# Patient Record
Sex: Male | Born: 1985
Health system: Southern US, Community
[De-identification: ages and names within clinical notes are randomized; demographics above are authoritative.]

## PROBLEM LIST (undated history)

## (undated) HISTORY — PX: NO PAST SURGERIES: SHX2092

---
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
## 2007-07-27 ENCOUNTER — Ambulatory Visit: Admission: RE | Admit: 2007-07-27 | Discharge: 2007-08-20 | Payer: Self-pay | Admitting: Radiation Oncology

## 2007-10-20 ENCOUNTER — Emergency Department (HOSPITAL_COMMUNITY): Admission: EM | Admit: 2007-10-20 | Discharge: 2007-10-20 | Payer: Self-pay | Admitting: Emergency Medicine

## 2008-04-16 ENCOUNTER — Emergency Department (HOSPITAL_COMMUNITY): Admission: EM | Admit: 2008-04-16 | Discharge: 2008-04-16 | Payer: Self-pay | Admitting: Family Medicine

## 2009-06-19 ENCOUNTER — Emergency Department (HOSPITAL_COMMUNITY): Admission: EM | Admit: 2009-06-19 | Discharge: 2009-06-19 | Payer: Self-pay | Admitting: Family Medicine

## 2011-02-26 LAB — POCT H PYLORI SCREEN: H. PYLORI SCREEN, POC: NEGATIVE

## 2011-10-21 ENCOUNTER — Encounter (HOSPITAL_COMMUNITY): Payer: Self-pay | Admitting: Emergency Medicine

## 2011-10-21 ENCOUNTER — Emergency Department (HOSPITAL_COMMUNITY)
Admission: EM | Admit: 2011-10-21 | Discharge: 2011-10-21 | Disposition: A | Discharge status: Home / Self Care | Payer: Self-pay | Attending: Emergency Medicine | Admitting: Emergency Medicine

## 2011-10-21 DIAGNOSIS — S01501A Unspecified open wound of lip, initial encounter: Secondary | ICD-10-CM | POA: Insufficient documentation

## 2011-10-21 DIAGNOSIS — W219XXA Striking against or struck by unspecified sports equipment, initial encounter: Secondary | ICD-10-CM | POA: Insufficient documentation

## 2011-10-21 DIAGNOSIS — S01511A Laceration without foreign body of lip, initial encounter: Secondary | ICD-10-CM

## 2011-10-21 MED ORDER — AMOXICILLIN-POT CLAVULANATE 875-125 MG PO TABS: 1.0000 | ORAL_TABLET | Freq: Two times a day (BID) | ORAL | Status: AC

## 2011-10-21 MED ORDER — CHLORHEXIDINE GLUCONATE 0.12 % MT SOLN: 15.0000 mL | Freq: Two times a day (BID) | OROMUCOSAL | Status: AC

## 2011-10-21 MED ORDER — IBUPROFEN 800 MG PO TABS: 800.0000 mg | ORAL_TABLET | Freq: Three times a day (TID) | ORAL | Status: AC | PRN

## 2011-10-21 NOTE — ED Notes (Signed)
MD at bedside for suturing of lip lac

## 2011-10-21 NOTE — ED Notes (Signed)
MD done with suturing.

## 2011-10-21 NOTE — ED Notes (Signed)
PT. PRESENTS WITH LACERATION AT RIGHT UPPER LIP / SWELLING ACCIDENTALLY HIT WITH A PLAYER'S HEAD WHILE PLAYING BASKETBALL THIS EVENING .

## 2011-10-21 NOTE — Discharge Instructions (Signed)
Have sutures out in 5 days. Return here as needed.  °

## 2011-10-21 NOTE — ED Provider Notes (Signed)
History     CSN: [id number]  Arrival date & time 10/21/11  1945   First MD Initiated Contact with Patient 10/21/11 1954      Chief Complaint  Patient presents with  . Lip Laceration    (Consider location/radiation/quality/duration/timing/severity/associated sxs/prior treatment) HPI Patient presents emergency department with a laceration to his right upper lip.  This was fine basketball when he had his lip from the head from another player.  Patient, states he did not lose consciousness and does not have any teeth that are damaged or loose.  Patient has headache, blurred vision, nausea, or vomiting.  Patient, states his tetanus shot is up-to-date. History reviewed. No pertinent past medical history.  History reviewed. No pertinent past surgical history.  No family history on file.  History  Substance Use Topics  . Smoking status: Never Smoker   . Smokeless tobacco: Not on file  . Alcohol Use: Yes      Review of Systems All other systems negative except as documented in the HPI. All pertinent positives and negatives as reviewed in the HPI.  Allergies  Review of patient's allergies indicates no known allergies.  Home Medications  No current outpatient prescriptions on file.  BP 130/87  Pulse 107  Temp(Src) 98.1 F (36.7 C) (Oral)  Resp 16  SpO2 98%  Physical Exam  Constitutional: He is oriented to person, place, and time. He appears well-developed and well-nourished. No distress.  HENT:  Head: Normocephalic.  Mouth/Throat: Oropharynx is clear and moist. Normal dentition.    Eyes: Pupils are equal, round, and reactive to light.  Neurological: He is alert and oriented to person, place, and time.    ED Course  Procedures (including critical care time)  LACERATION REPAIR Performed by: Carlyle Dolly Authorized by: Carlyle Dolly Consent: Verbal consent obtained. Risks and benefits: risks, benefits and alternatives were discussed Consent given  by: patient Patient identity confirmed: provided demographic data Prepped and Draped in normal sterile fashion Wound explored  Laceration Location: upper lip on the R mid and inferior border extending into the oral mucosa  Laceration Length: 0.5cm mid upper right lip. Then 3.5 cms on the inferior border extending to the oral mucosa.  No Foreign Bodies seen or palpated  Anesthesia: local infiltration  Local anesthetic: lidocaine 2%  Anesthetic total: 4 ml  Irrigation method: syringe Amount of cleaning: standard  Skin closure: prolene 7-0  Number of sutures: 3- 1 in the mid upper lip small lac and 2 in the inferior lip   Technique: Simple interrupted  Patient tolerance: Patient tolerated the procedure well with no immediate complications.  The oral mucosa is left open.  MDM          Carlyle Dolly, PA-C 10/21/11 2107

## 2011-10-21 NOTE — ED Provider Notes (Signed)
Medical screening examination/treatment/procedure(s) were performed by non-physician practitioner and as supervising physician I was immediately available for consultation/collaboration.  Ethelda Chick, MD 10/21/11 2120

## 2015-06-14 ENCOUNTER — Emergency Department (HOSPITAL_COMMUNITY)
Admission: EM | Admit: 2015-06-14 | Discharge: 2015-06-14 | Disposition: A | Discharge status: Home / Self Care | Payer: Self-pay | Attending: Emergency Medicine | Admitting: Emergency Medicine

## 2015-06-14 ENCOUNTER — Encounter (HOSPITAL_COMMUNITY): Payer: Self-pay | Admitting: *Deleted

## 2015-06-14 DIAGNOSIS — K0889 Other specified disorders of teeth and supporting structures: Secondary | ICD-10-CM | POA: Insufficient documentation

## 2015-06-14 MED ORDER — IBUPROFEN 800 MG PO TABS: 800.0000 mg | ORAL_TABLET | Freq: Three times a day (TID) | ORAL | Status: DC | PRN

## 2015-06-14 NOTE — ED Notes (Signed)
Pt reports intermittent dental pain and sensitivity to cold fluids  For 1 week.

## 2015-06-14 NOTE — Discharge Instructions (Signed)
Read the information below.  Use the prescribed medication as directed.  Please discuss all new medications with your pharmacist.  You may return to the Emergency Department at any time for worsening condition or any new symptoms that concern you.   Please call the dentist listed above within 48 hours to schedule a close follow up appointment.  If you develop fevers, swelling in your face, difficulty swallowing or breathing, return to the ER immediately for a recheck.   ° ° °Emergency Department Resource Guide °1) Find a Doctor and Pay Out of Pocket °Although you won't have to find out who is covered by your insurance plan, it is a good idea to ask around and get recommendations. You will then need to call the office and see if the doctor you have chosen will accept you as a new patient and what types of options they offer for patients who are self-pay. Some doctors offer discounts or will set up payment plans for their patients who do not have insurance, but you will need to ask so you aren't surprised when you get to your appointment. ° °2) Contact Your Local Health Department °Not all health departments have doctors that can see patients for sick visits, but many do, so it is worth a call to see if yours does. If you don't know where your local health department is, you can check in your phone book. The CDC also has a tool to help you locate your state's health department, and many state websites also have listings of all of their local health departments. ° °3) Find a Walk-in Clinic °If your illness is not likely to be very severe or complicated, you may want to try a walk in clinic. These are popping up all over the country in pharmacies, drugstores, and shopping centers. They're usually staffed by nurse practitioners or physician assistants that have been trained to treat common illnesses and complaints. They're usually fairly quick and inexpensive. However, if you have serious medical issues or chronic  medical problems, these are probably not your best option. ° °No Primary Care Doctor: °- Call [facility] at  832-8000 - they can help you locate a primary care doctor that  accepts your insurance, provides certain services, etc. °- Physician Referral Service- 1-800-533-3463 ° °Chronic Pain Problems: °Organization         Address  Phone   Notes  °[facility]  (336) 297-2271 Patients need to be referred by their primary care doctor.  ° °Medication Assistance: °Organization         Address  Phone   Notes  °[place] [facility] [address] °[place], [place] [address] (336) 641-8030 --Must be a resident of [place] °-- Must have NO insurance coverage whatsoever (no Medicaid/ Medicare, etc.) °-- The pt. MUST have a primary care doctor that directs their care regularly and follows them in the community °  °[facility]  (866) 331-1348   °[employer]  (888) 892-1162   ° °Agencies that provide inexpensive medical care: °Organization         Address  Phone   Notes  °[facility]  (336) 832-8035   °[facility]    (336) 832-7272   °[facility] [address] °[place], [place] [address] (336) 832-4777   °[facility] [address], °[place] (336) 271-4999   °[facility]    (336) 373-0678   °[facility]    (336) 272-1050   °[facility] ° [address], [place] Phone:  (336) 832-4444, Fax:  (336) 832-4440 Hours of Operation:  9 am - 6 pm, M-F.  Also accepts Medicaid/Medicare and self-pay.  °[facility] ° [address], [place] Phone: (336) 832-3150, Fax: (336) 832-3151. Hours of Operation:  8:30 am - 5:30 pm, M-F.  Also accepts Medicaid and self-pay.  °[facility] [place] [address], [place] Phone: (336) 878-6027   °[facility] [address], [place], [place] (336)723-1848,  Ext. 123 Mondays & Thursdays: 7-9 AM.  First 15 patients are seen on a first come, first serve basis. °  ° °Medicaid-accepting [place] Providers: ° °Organization         Address  Phone   Notes  °[facility] [address], [place] (336) 641-2100 Also accepts self-pay patients.  °[facility] [address], [place] ° (336) 856-9996   °[facility] [address], [place] (336) 288-8857   °[facility] [address] [place] [address], [place] (336) 299-7000   °Veita Bland [address], [place]  ° (336) 373-1557 Only accepts  Access Medicaid patients after they have their name applied to their card.  ° °Self-Pay (no insurance) in [place]: ° °Organization         Address  Phone   Notes  °Sickle Cell Patients, [facility] [address], [place] (336) 832-1970   °[facility] [address], [place] (336) 832-4400   °[facility] ° [address], [place] (336) 992-4800   °[facility]/Dr. Osei-Bonsu ° [address] [place] [address], [place] or [address], [place] (336) 841-8500 Phone number for both [place] and [place] locations is the same.  °[facility] [address], [place] (336) 299-0000   °[facility] [address] [place] [address], [place] or [address] (336) 852-7530 °(336) 878-2260   °[facility] [address], [place] (336) 350-1642, phone; (336) 294-5005, fax Sees patients 1st and 3rd Saturday of every month.  Must not qualify for public or private insurance (i.e. Medicaid, Medicare, [place] Health Choice, Veterans' Benefits) • Household income should be no more than 200% of the poverty level •The clinic cannot treat you if you are pregnant or think you are pregnant • Sexually transmitted diseases are not  treated at the clinic.  ° ° °Dental Care: °Organization         Address  Phone  Notes  °[facility] [address],  (336) 641-6152 Accepts children up to age 21 who are enrolled in Medicaid or [place] Health Choice; pregnant women with a Medicaid card; and children who have applied for Medicaid or [place] Health Choice, but were declined, whose parents can pay a reduced fee at time of service.  °[facility] [place]  [address], [place] (336) 641-7733 Accepts children up to age 21 who are enrolled in Medicaid or [place] Health Choice; pregnant women with a Medicaid card; and children who have applied for Medicaid or [place] Health Choice, but were declined, whose parents can pay a reduced fee at time of service.  °  [facility] ° [address], [place] (336) 641-4533 Patients are seen by appointment only. Walk-ins are not accepted. [facility] will see patients 18 years of age and older. °Monday - Tuesday (8am-5pm) °Most Wednesdays (8:30-5pm) °$30 per visit, cash only  °[facility] ° [address], [place] (336) 641-4533 Patients are seen by appointment only. Walk-ins are not accepted. [facility] will see patients 18 years of age and older. °One Wednesday Evening (Monthly: Volunteer Based).  $30 per visit, cash only  °[facility]  (919) 537-3737 for adults; Children under age 4, call Graduate Pediatric Dentistry at (919) [phone]. Children aged 4-14, please call (919) 537-3737 to request a pediatric application. ° Dental services are provided in all areas of dental care including fillings, crowns and bridges, complete and partial dentures, implants, gum treatment, root canals, and extractions. Preventive care is also provided. Treatment is provided to both adults and children. °Patients are selected via a lottery and there is  often a waiting list. °  °[facility] [address], °[place] ° (336) 763-8833 [email or web address] °  °[facility] [address], [place], [place] (336)723-1848, Ext. 123 Second and Fourth Thursday of each month, opens at 6:30 AM; Clinic ends at 9 AM.  Patients are seen on a first-come first-served basis, and a limited number are seen during each clinic.  ° °[facility] ° [address], [place], [place] (336) 723-7904   Eligibility Requirements °You must have lived in [place], [place], or [place] counties for at least the last three months. °  You cannot be eligible for state or federal sponsored healthcare insurance, including Veterans Administration, Medicaid, or Medicare. °  You generally cannot be eligible for healthcare insurance through your employer.  °  How to apply: °Eligibility screenings are held every Tuesday and Wednesday afternoon from 1:00 pm until 4:00 pm. You do not need an appointment for the interview!  °[facility] [address], [place], [place] 336-631-2330   °[facility]  336-342-8273   °[facility]  336-703-3100   ° [facility]  336-570-6415   ° °Behavioral Health Resources in the Community: °Intensive Outpatient Programs °Organization         Address  Phone  Notes  °[facility] [address], [place], [place] 336-878-6098   °[facility] [address], [place], [place] 336-832-9800   °[facility] [address], [place], [place] ° 336-882-2125   °[facility] [address],  °[place],  1-800-853-5163 or 336-641-4981   °Substance Abuse Resources °Organization         Address  Phone  Notes  °[facility]  336-882-2125   °[facility]  336-784-9470   °[facility]  336-285-9073   °[facility]  336-845-3988   °Residential & Outpatient Substance  Abuse Program  1-800-659-3381   °Psychological Services °Organization         Address  Phone  Notes  °[facility]  336- 832-9600   °[facility]  336- 378-7881   °[facility] [address],  1-800-853-5163 or 336-641-4981   ° °Mobile Crisis Teams °Organization         Address  Phone  Notes  °[facility], Mobile Crisis Care Unit  1-877-626-1772   °Assertive °Psychotherapeutic Services ° [address] [place], [place] 336-834-9664   °Sharon DeEsch [address] °[place] 336-554-5454   ° °Self-Help/Support Groups °Organization         Address  Phone             Notes  °[facility] [place] - variety of support groups  336- 373-1402 Call for more information  °Narcotics Anonymous (NA), [facility] [address], °[place]  2 meetings at this location  ° °Residential Treatment Programs °Organization         Address  Phone  Notes  °[facility] [address],    °[place]  1-866-801-8205   °[facility] ° [address], [place], [place] 704-293-8524   °[facility] [address], [place] 336-845-3988 Admissions: 8am-3pm M-F  °[facility] [address]    °[place], [place] 336-841-1104   °[facility] [address], [place], [place] 336-379-7146   °[facility] [address]  °[place], [place] 336-285-9073   °[facility] - Intensive Outpatient [address], [place], [place] 336-852-3033   °[facility] [address]  °[place], [place] 1-877-615-2722 or 336-784-9470   °[facility] [address] [place], [place] 336-227-7417 Accepts Medicaid  °[facility] [address]  °[place] 1-800-659-3381 Substance Abuse/Addiction Treatment  ° °[place] Behavioral Health Resources °Organization          Address  Phone  Notes  °[facility]  (888) 581-9988   °Julie Brannon, PhD [address] [place], [place]   (336) 349-5553 or (336) 951-0000   °[facility]   [address] °[place], [place] (336) 349-4454   °[facility] [address], [place], [place] (336) 342-8316 Insurance/Medicaid/sponsorship through [facility]  °[facility] [address]                                    [place], [place] (336) 342-8316 Therapy/tele-psych/case  °[facility] [address]  ° [place], [place] (336) 349-2233    °Dr. Arfeen  (336) 349-4544   °[facility] [place]  [employer] [place] [facility] 1) [address], [place] °2) [address], [place] °3)  [address], [place] (336) 349-3220 °(336) 342-7768 ° °(336) 342-8140   °[place] [facility] (336) 342-1394 or (336) 342-3537 (After Hours)    ° ° ° °

## 2015-06-14 NOTE — ED Provider Notes (Signed)
CSN: [id number]     Arrival date & time 06/14/15  [id number] History   First MD Initiated Contact with Patient 06/14/15 [phone]     Chief Complaint  Patient presents with  . Dental Pain     (Consider location/radiation/quality/duration/timing/severity/associated sxs/prior Treatment) The history is provided by the patient.     Pt presents with right upper and right lower dental pain.  States the pain has been going on for a little over a week.  Pain is present with cold air, occasionally with certain movements, with lying on the right side.   Denies fevers, facial swelling, sore throat, difficulty swallowing or breathing.  Has taken motrin with some relief.  Has no dentist.     History reviewed. No pertinent past medical history. History reviewed. No pertinent past surgical history. No family history on file. Social History  Substance Use Topics  . Smoking status: Never Smoker   . Smokeless tobacco: None  . Alcohol Use: Yes    Review of Systems  Constitutional: Negative for fever and chills.  HENT: Positive for dental problem. Negative for facial swelling, sore throat and trouble swallowing.   Respiratory: Negative for shortness of breath.   Musculoskeletal: Negative for myalgias.  Skin: Negative for color change and wound.  Allergic/Immunologic: Negative for immunocompromised state.  Hematological: Does not bruise/bleed easily.  Psychiatric/Behavioral: Negative for self-injury.      Allergies  Review of patient's allergies indicates no known allergies.  Home Medications   Prior to Admission medications   Not on File   BP 134/85 mmHg  Pulse 73  Temp(Src) 98.7 F (37.1 C) (Oral)  Resp 20  SpO2 99% Physical Exam  Constitutional: He appears well-developed and well-nourished. No distress.  HENT:  Head: Normocephalic and atraumatic.  Mouth/Throat: Uvula is midline and oropharynx is clear and moist. Mucous membranes are not dry. No uvula swelling. No oropharyngeal exudate,  posterior oropharyngeal edema, posterior oropharyngeal erythema or tonsillar abscesses.  Pain located in 3rd molars, upper and lower on right side with no abnormality on exam.    Neck: Normal range of motion. Neck supple.  Cardiovascular: Normal rate.   Pulmonary/Chest: Effort normal and breath sounds normal. No stridor.  Lymphadenopathy:    He has no cervical adenopathy.  Neurological: He is alert.  Skin: He is not diaphoretic.  Nursing note and vitals reviewed.   ED Course  Procedures (including critical care time) Labs Review Labs Reviewed - No data to display  Imaging Review No results found. I have personally reviewed and evaluated these images and lab results as part of my medical decision-making.   EKG Interpretation None      MDM   Final diagnoses:  Pain, dental    Afebrile, nontoxic patient with new dental pain.  No obvious abscess.  No concerning findings on exam.  Doubt deep space head or neck infection.  Doubt Ludwig's angina.  Suspect pain is from upper and lower wisdom teeth.   D/C home with NSAID pain medication and dental follow up.  Discussed findings, treatment, and follow up  with patient.  Pt given return precautions.  Pt verbalizes understanding and agrees with plan.         Trixie Dredgemily Hosteen Kienast, PA-C 06/14/15 [id number]  Melene Planan Floyd, DO 06/14/15 1034

## 2015-06-14 NOTE — ED Notes (Signed)
Declined W/C at D/C and was escorted to lobby by RN. 

## 2016-11-01 ENCOUNTER — Encounter (HOSPITAL_COMMUNITY): Payer: Self-pay

## 2016-11-01 DIAGNOSIS — B349 Viral infection, unspecified: Secondary | ICD-10-CM | POA: Insufficient documentation

## 2016-11-01 DIAGNOSIS — F17210 Nicotine dependence, cigarettes, uncomplicated: Secondary | ICD-10-CM | POA: Insufficient documentation

## 2016-11-01 DIAGNOSIS — J069 Acute upper respiratory infection, unspecified: Secondary | ICD-10-CM | POA: Insufficient documentation

## 2016-11-01 DIAGNOSIS — M791 Myalgia: Secondary | ICD-10-CM | POA: Insufficient documentation

## 2016-11-01 DIAGNOSIS — E860 Dehydration: Secondary | ICD-10-CM | POA: Insufficient documentation

## 2016-11-01 LAB — COMPREHENSIVE METABOLIC PANEL
ALBUMIN: 4.1 g/dL (ref 3.5–5.0)
ALT: 33 U/L (ref 17–63)
AST: 27 U/L (ref 15–41)
Alkaline Phosphatase: 95 U/L (ref 38–126)
Anion gap: 8 (ref 5–15)
BILIRUBIN TOTAL: 0.7 mg/dL (ref 0.3–1.2)
BUN: 6 mg/dL (ref 6–20)
CHLORIDE: 107 mmol/L (ref 101–111)
CO2: 22 mmol/L (ref 22–32)
CREATININE: 1.52 mg/dL — AB (ref 0.61–1.24)
Calcium: 8.9 mg/dL (ref 8.9–10.3)
GFR calc Af Amer: >60 mL/min (ref >60)
GLUCOSE: 116 mg/dL — AB (ref 65–99)
POTASSIUM: 3.5 mmol/L (ref 3.5–5.1)
Sodium: 137 mmol/L (ref 135–145)
Total Protein: 7.1 g/dL (ref 6.5–8.1)

## 2016-11-01 LAB — URINALYSIS, ROUTINE W REFLEX MICROSCOPIC
BACTERIA UA: NONE SEEN
Bilirubin Urine: NEGATIVE
Glucose, UA: NEGATIVE mg/dL
Hgb urine dipstick: NEGATIVE
Ketones, ur: 5 mg/dL — AB
Leukocytes, UA: NEGATIVE
Nitrite: NEGATIVE
PH: 5 (ref 5.0–8.0)
Protein, ur: 100 mg/dL — AB
Specific Gravity, Urine: 1.025 (ref 1.005–1.030)

## 2016-11-01 LAB — CBC
HCT: 46.2 % (ref 39.0–52.0)
Hemoglobin: 15.6 g/dL (ref 13.0–17.0)
MCH: 27.5 pg (ref 26.0–34.0)
MCHC: 33.8 g/dL (ref 30.0–36.0)
MCV: 81.3 fL (ref 78.0–100.0)
PLATELETS: 183 10*3/uL (ref 150–400)
RBC: 5.68 MIL/uL (ref 4.22–5.81)
RDW: 13.9 % (ref 11.5–15.5)
WBC: 8 10*3/uL (ref 4.0–10.5)

## 2016-11-01 LAB — LIPASE, BLOOD: LIPASE: 23 U/L (ref 11–51)

## 2016-11-01 MED ORDER — ONDANSETRON 4 MG PO TBDP
ORAL_TABLET | ORAL | Status: AC
  Filled 2016-11-01: qty 1

## 2016-11-01 MED ORDER — ONDANSETRON 4 MG PO TBDP
4.0000 mg | ORAL_TABLET | Freq: Once | ORAL | Status: AC | PRN
  Administered 2016-11-01: 4 mg via ORAL

## 2016-11-01 NOTE — ED Triage Notes (Signed)
Pt complaining generalized body aches and nasal congestion. Pt states 3 episodes of diarrhea today. Pt states congested cough x 1 day.

## 2016-11-02 ENCOUNTER — Emergency Department (HOSPITAL_COMMUNITY)
Admission: EM | Admit: 2016-11-02 | Discharge: 2016-11-02 | Disposition: A | Discharge status: Home / Self Care | Payer: Self-pay | Attending: Emergency Medicine | Admitting: Emergency Medicine

## 2016-11-02 ENCOUNTER — Emergency Department (HOSPITAL_COMMUNITY): Payer: Self-pay

## 2016-11-02 DIAGNOSIS — E860 Dehydration: Secondary | ICD-10-CM

## 2016-11-02 DIAGNOSIS — B349 Viral infection, unspecified: Secondary | ICD-10-CM

## 2016-11-02 DIAGNOSIS — J069 Acute upper respiratory infection, unspecified: Secondary | ICD-10-CM

## 2016-11-02 DIAGNOSIS — R112 Nausea with vomiting, unspecified: Secondary | ICD-10-CM

## 2016-11-02 DIAGNOSIS — R197 Diarrhea, unspecified: Secondary | ICD-10-CM

## 2016-11-02 MED ORDER — ONDANSETRON HCL 4 MG/2ML IJ SOLN
4.0000 mg | Freq: Once | INTRAMUSCULAR | Status: AC
  Administered 2016-11-02: 4 mg via INTRAVENOUS
  Filled 2016-11-02: qty 2

## 2016-11-02 MED ORDER — ALBUTEROL SULFATE HFA 108 (90 BASE) MCG/ACT IN AERS
2.0000 | INHALATION_SPRAY | RESPIRATORY_TRACT | Status: DC
  Administered 2016-11-02: 2 via RESPIRATORY_TRACT
  Filled 2016-11-02: qty 6.7

## 2016-11-02 MED ORDER — ACETAMINOPHEN 325 MG PO TABS
650.0000 mg | ORAL_TABLET | Freq: Once | ORAL | Status: AC
  Administered 2016-11-02: 650 mg via ORAL
  Filled 2016-11-02: qty 2

## 2016-11-02 MED ORDER — SODIUM CHLORIDE 0.9 % IV BOLUS (SEPSIS)
1000.0000 mL | Freq: Once | INTRAVENOUS | Status: AC
  Administered 2016-11-02: 1000 mL via INTRAVENOUS

## 2016-11-02 MED ORDER — KETOROLAC TROMETHAMINE 30 MG/ML IJ SOLN
30.0000 mg | Freq: Once | INTRAMUSCULAR | Status: AC
  Administered 2016-11-02: 30 mg via INTRAVENOUS
  Filled 2016-11-02: qty 1

## 2016-11-02 MED ORDER — ONDANSETRON 8 MG PO TBDP: 8.0000 mg | ORAL_TABLET | Freq: Three times a day (TID) | ORAL | 0 refills | Status: DC | PRN

## 2016-11-02 NOTE — ED Provider Notes (Signed)
[facility] Provider Note   CSN: [id number] Arrival date & time: 11/01/16  2057 By signing my name below, I, Levon HedgerElizabeth Hall, attest that this documentation has been prepared under the direction and in the presence of Azalia Bilisampos, Sanna Porcaro, MD . Electronically Signed: Levon HedgerElizabeth Hall, Scribe. 11/02/2016. 2:31 AM.   History   Chief Complaint Chief Complaint  Patient presents with  . Generalized Body Aches  . Cough  . Diarrhea   HPI Edward Everett is a 31 y.o. male who presents to the Emergency Department complaining of intermittent diarrhea (x3) onset today. He reports associated chills, fever, nausea, vomiting, headache, cough and generalized body aches. Per pt, he has been unable to keep anything down today. No OTC treatments tried for these symptoms PTA.  He was given Zofran today upon arrival with moderate relief of nausea. No hx of asthma. Pt has no other acute complaints or associated symptoms at this time.   The history is provided by the patient. No language interpreter was used.    History reviewed. No pertinent past medical history.  There are no active problems to display for this patient.   History reviewed. No pertinent surgical history.     Home Medications    Prior to Admission medications   Medication Sig Start Date End Date Taking? Authorizing Provider  ibuprofen (ADVIL,MOTRIN) 800 MG tablet Take 1 tablet (800 mg total) by mouth every 8 (eight) hours as needed for mild pain or moderate pain. 06/14/15   Trixie DredgeWest, Emily, PA-C    Family History History reviewed. No pertinent family history.  Social History Social History  Substance Use Topics  . Smoking status: Current Every Day Smoker  . Smokeless tobacco: Never Used  . Alcohol use Yes     Allergies   Patient has no known allergies.   Review of Systems Review of Systems All systems reviewed and are negative for acute change except as noted in the HPI.  Physical Exam Updated Vital Signs BP 125/69 (BP  Location: Left Arm)   Pulse 94   Temp 99.9 F (37.7 C) (Oral)   Resp 16   SpO2 94%   Physical Exam  Constitutional: He is oriented to person, place, and time. He appears well-developed and well-nourished.  HENT:  Head: Normocephalic and atraumatic.  Eyes: EOM are normal.  Neck: Normal range of motion.  Cardiovascular: Normal rate, regular rhythm, normal heart sounds and intact distal pulses.   Pulmonary/Chest: Effort normal and breath sounds normal. No respiratory distress.  Abdominal: Soft. He exhibits no distension. There is no tenderness.  Musculoskeletal: Normal range of motion.  Neurological: He is alert and oriented to person, place, and time.  Skin: Skin is warm and dry.  Psychiatric: He has a normal mood and affect. Judgment normal.  Nursing note and vitals reviewed.  ED Treatments / Results  DIAGNOSTIC STUDIES:  Oxygen Saturation is 94% on RA, adequate by my interpretation.    COORDINATION OF CARE:  2:11 AM Will order Zofran and Toradol. Discussed treatment plan with pt at bedside and pt agreed to plan.   Labs (all labs ordered are listed, but only abnormal results are displayed) Labs Reviewed  COMPREHENSIVE METABOLIC PANEL - Abnormal; Notable for the following:       Result Value   Glucose, Bld 116 (*)    Creatinine, Ser 1.52 (*)    All other components within normal limits  URINALYSIS, ROUTINE W REFLEX MICROSCOPIC - Abnormal; Notable for the following:    APPearance HAZY (*)  Ketones, ur 5 (*)    Protein, ur 100 (*)    Squamous Epithelial / LPF 0-5 (*)    All other components within normal limits  LIPASE, BLOOD  CBC    EKG  EKG Interpretation None       Radiology Dg Chest 2 View  Result Date: 11/02/2016 CLINICAL DATA:  Acute onset of generalized body aches and nasal congestion. Diarrhea and cough. Initial encounter. EXAM: CHEST  2 VIEW COMPARISON:  None. FINDINGS: The lungs are well-aerated. Pulmonary vascularity is at the upper limits of normal.  There is no evidence of focal opacification, pleural effusion or pneumothorax. The heart is normal in size; the mediastinal contour is within normal limits. No acute osseous abnormalities are seen. IMPRESSION: No acute cardiopulmonary process seen. Electronically Signed   By: Roanna Raider M.D.   On: 11/02/2016 02:32    Procedures Procedures (including critical care time)  Medications Ordered in ED Medications  ondansetron (ZOFRAN-ODT) 4 MG disintegrating tablet (not administered)  sodium chloride 0.9 % bolus 1,000 mL (not administered)  ondansetron (ZOFRAN) injection 4 mg (not administered)  ketorolac (TORADOL) 30 MG/ML injection 30 mg (not administered)  acetaminophen (TYLENOL) tablet 650 mg (not administered)  ondansetron (ZOFRAN-ODT) disintegrating tablet 4 mg (4 mg Oral Given 11/01/16 2115)     Initial Impression / Assessment and Plan / ED Course  I have reviewed the triage vital signs and the nursing notes.  Pertinent labs & imaging results that were available during my care of the patient were reviewed by me and considered in my medical decision making (see chart for details).     3:51 AM Overall the patient is well-appearing.  Likely viral syndrome with GI symptoms and upper respiratory symptoms.  Primary care follow-up.  Recommended oral hydration and ibuprofen and Tylenol at home.  Chest x-ray without pneumonia.  Final Clinical Impressions(s) / ED Diagnoses   Final diagnoses:  None    New Prescriptions New Prescriptions   No medications on file   I personally performed the services described in this documentation, which was scribed in my presence. The recorded information has been reviewed and is accurate.        Azalia Bilis, MD 11/02/16 [phone]

## 2017-11-16 ENCOUNTER — Encounter (HOSPITAL_COMMUNITY): Payer: Self-pay | Admitting: Physician Assistant

## 2017-11-16 ENCOUNTER — Ambulatory Visit (HOSPITAL_COMMUNITY)
Admission: EM | Admit: 2017-11-16 | Discharge: 2017-11-16 | Disposition: A | Discharge status: Home / Self Care | Payer: Self-pay | Attending: Internal Medicine | Admitting: Internal Medicine

## 2017-11-16 DIAGNOSIS — T1501XA Foreign body in cornea, right eye, initial encounter: Secondary | ICD-10-CM

## 2017-11-16 MED ORDER — OFLOXACIN 0.3 % OP SOLN: 1.0000 [drp] | Freq: Four times a day (QID) | OPHTHALMIC | 0 refills | Status: DC

## 2017-11-16 MED ORDER — OFLOXACIN 0.3 % OP SOLN: 1.0000 [drp] | Freq: Four times a day (QID) | OPHTHALMIC | 0 refills | Status: AC

## 2017-11-16 NOTE — ED Provider Notes (Signed)
[facility]    CSN: [id number] Arrival date & time: 11/16/17  1827     History   Chief Complaint Chief Complaint  Patient presents with  . Eye Injury    HPI Edward Everett is a 32 y.o. male.   32 year old male comes in for 3-day history of possible right eye foreign body.  States he was working on metal when symptoms first started despite protective eye wear.  He has had right eye irritation, redness, foreign body sensation.  No crusting in the morning.  Has has photophobia.  States eyes continue to water, causing blurry vision.  Has tried to use Visine to flush the eye out.  Denies URI symptoms such as cough, congestion, sore throat.  Denies sneezing, eye itching.  No contact lens, glasses use.     History reviewed. No pertinent past medical history.  There are no active problems to display for this patient.   History reviewed. No pertinent surgical history.     Home Medications    Prior to Admission medications   Medication Sig Start Date End Date Taking? Authorizing Provider  ofloxacin (OCUFLOX) 0.3 % ophthalmic solution Place 1 drop into the right eye 4 (four) times daily for 7 days. 11/16/17 11/23/17  Belinda FisherYu, Kandra Graven V, PA-C    Family History No family history on file.  Social History Social History   Tobacco Use  . Smoking status: Current Every Day Smoker  . Smokeless tobacco: Never Used  Substance Use Topics  . Alcohol use: Yes  . Drug use: No     Allergies   Patient has no known allergies.   Review of Systems Review of Systems  Reason unable to perform ROS: See HPI as above.     Physical Exam Triage Vital Signs ED Triage Vitals  Enc Vitals Group     BP 11/16/17 1938 136/89     Pulse Rate 11/16/17 1938 90     Resp 11/16/17 1938 20     Temp 11/16/17 1938 98.4 F (36.9 C)     Temp Source 11/16/17 1938 Oral     SpO2 11/16/17 1938 100 %     Weight --      Height --      Head Circumference --      Peak Flow --      Pain Score 11/16/17  1936 8     Pain Loc --      Pain Edu? --      Excl. in GC? --    No data found.  Updated Vital Signs BP 136/89 (BP Location: Left Arm)   Pulse 90   Temp 98.4 F (36.9 C) (Oral)   Resp 20   SpO2 100%   Visual Acuity Right Eye Distance: 20/40 Left Eye Distance: 20/20 Bilateral Distance: 20/25  Right Eye Near:   Left Eye Near:    Bilateral Near:     Physical Exam  Constitutional: He is oriented to person, place, and time. He appears well-developed and well-nourished. No distress.  HENT:  Head: Normocephalic and atraumatic.  Eyes: Pupils are equal, round, and reactive to light. EOM are normal. Foreign body present in the right eye. Right conjunctiva is injected. Left conjunctiva is not injected.  Foreign body seen around 6-7 o'clock of iris.  No obvious rust ring.  Fluorescein stain with uptake around the foreign body.  Neck: Normal range of motion. Neck supple.  Neurological: He is alert and oriented to person, place, and time.  Skin:  Skin is warm and dry.     UC Treatments / Results  Labs (all labs ordered are listed, but only abnormal results are displayed) Labs Reviewed - No data to display  EKG None  Radiology No results found.  Procedures Procedures (including critical care time)  Medications Ordered in UC Medications - No data to display  Initial Impression / Assessment and Plan / UC Course  I have reviewed the triage vital signs and the nursing notes.  Pertinent labs & imaging results that were available during my care of the patient were reviewed by me and considered in my medical decision making (see chart for details).    Discussed case with on-call ophthalmologist, Dr. Charlotte Sanes.  She will see patient in office tomorrow for foreign body removal at 8:30 AM.  Will start patient on ofloxacin as per Dr. Charlotte Sanes. Patient expresses understanding and agrees to plan.  Final Clinical Impressions(s) / UC Diagnoses   Final diagnoses:  Foreign body in cornea,  right eye, initial encounter    ED Prescriptions    Medication Sig Dispense Auth. Provider   ofloxacin (OCUFLOX) 0.3 % ophthalmic solution  (Status: Discontinued) Place 1 drop into the right eye 4 (four) times daily for 7 days. 1.4 mL Tailor Westfall V, PA-C   ofloxacin (OCUFLOX) 0.3 % ophthalmic solution Place 1 drop into the right eye 4 (four) times daily for 7 days. 1.4 mL Threasa Alpha, [place] 11/16/17 2028

## 2017-11-16 NOTE — Discharge Instructions (Addendum)
I talked with the oncall doctor, Dr Charlotte SanesMcCuen. She will see you tomorrow at 8:30am in office. Start ofloxacin drops as directed. Follow up with Dr Charlotte SanesMcCuen at 8:30am for removal of foreign body.

## 2017-11-16 NOTE — ED Triage Notes (Signed)
Pt is unsure if he scratched his eye or if he got something in his eye. He tried to flush his eye with visine but it is still red, irritated and painful.

## 2018-04-14 ENCOUNTER — Ambulatory Visit: Payer: BLUE CROSS/BLUE SHIELD | Attending: Family Medicine | Admitting: Family Medicine

## 2018-04-14 ENCOUNTER — Encounter: Payer: Self-pay | Admitting: Family Medicine

## 2018-04-14 VITALS — BP 125/85 | HR 72 | Temp 99.2°F | Resp 16 | Ht 72.0 in | Wt 273.4 lb

## 2018-04-14 DIAGNOSIS — Z6837 Body mass index (BMI) 37.0-37.9, adult: Secondary | ICD-10-CM

## 2018-04-14 DIAGNOSIS — J40 Bronchitis, not specified as acute or chronic: Secondary | ICD-10-CM | POA: Diagnosis not present

## 2018-04-14 DIAGNOSIS — R05 Cough: Secondary | ICD-10-CM | POA: Diagnosis not present

## 2018-04-14 DIAGNOSIS — Z6839 Body mass index (BMI) 39.0-39.9, adult: Secondary | ICD-10-CM | POA: Insufficient documentation

## 2018-04-14 DIAGNOSIS — Z720 Tobacco use: Secondary | ICD-10-CM

## 2018-04-14 DIAGNOSIS — E669 Obesity, unspecified: Secondary | ICD-10-CM

## 2018-04-14 DIAGNOSIS — J209 Acute bronchitis, unspecified: Secondary | ICD-10-CM | POA: Insufficient documentation

## 2018-04-14 MED ORDER — TETANUS-DIPHTH-ACELL PERTUSSIS 5-2.5-18.5 LF-MCG/0.5 IM SUSP: 0.5000 mL | Freq: Once | INTRAMUSCULAR | 0 refills | Status: AC

## 2018-04-14 MED ORDER — CETIRIZINE HCL 10 MG PO TABS: 10.0000 mg | ORAL_TABLET | Freq: Every day | ORAL | 6 refills | Status: DC

## 2018-04-14 MED ORDER — AZITHROMYCIN 250 MG PO TABS: ORAL_TABLET | ORAL | 0 refills | Status: DC

## 2018-04-14 MED FILL — BOOSTRIX VACCINE SYRINGE: 5-2.5-18.5 | 1 days supply | Qty: 1 | Fill #0

## 2018-04-14 NOTE — Patient Instructions (Addendum)
Acute Bronchitis, Adult Acute bronchitis is when air tubes (bronchi) in the lungs suddenly get swollen. The condition can make it hard to breathe. It can also cause these symptoms:  A cough.  Coughing up clear, yellow, or green mucus.  Wheezing.  Chest congestion.  Shortness of breath.  A fever.  Body aches.  Chills.  A sore throat.  Follow these instructions at home: Medicines  Take over-the-counter and prescription medicines only as told by your doctor.  If you were prescribed an antibiotic medicine, take it as told by your doctor. Do not stop taking the antibiotic even if you start to feel better. General instructions  Rest.  Drink enough fluids to keep your pee (urine) clear or pale yellow.  Avoid smoking and secondhand smoke. If you smoke and you need help quitting, ask your doctor. Quitting will help your lungs heal faster.  Use an inhaler, cool mist vaporizer, or humidifier as told by your doctor.  Keep all follow-up visits as told by your doctor. This is important. How is this prevented? To lower your risk of getting this condition again:  Wash your hands often with soap and water. If you cannot use soap and water, use hand sanitizer.  Avoid contact with people who have cold symptoms.  Try not to touch your hands to your mouth, nose, or eyes.  Make sure to get the flu shot every year.  Contact a doctor if:  Your symptoms do not get better in 2 weeks. Get help right away if:  You cough up blood.  You have chest pain.  You have very bad shortness of breath.  You become dehydrated.  You faint (pass out) or keep feeling like you are going to pass out.  You keep throwing up (vomiting).  You have a very bad headache.  Your fever or chills gets worse. This information is not intended to replace advice given to you by your health care provider. Make sure you discuss any questions you have with your health care provider. Document Released:  11/05/2007 Document Revised: 12/26/2015 Document Reviewed: 11/07/2015 Elsevier Interactive Patient Education  2018 [employer].  Health Risks of Smoking Smoking cigarettes is very bad for your health. Tobacco smoke has over 200 known poisons in it. It contains the poisonous gases nitrogen oxide and carbon monoxide. There are over 60 chemicals in tobacco smoke that cause cancer. Smoking is difficult to quit because a chemical in tobacco, called nicotine, causes addiction or dependence. When you smoke and inhale, nicotine is absorbed rapidly into the bloodstream through your lungs. Both inhaled and non-inhaled nicotine may be addictive. What are the risks of cigarette smoke? Cigarette smokers have an increased risk of many serious medical problems, including:  Lung cancer.  Lung disease, such as pneumonia, bronchitis, and emphysema.  Chest pain (angina) and heart attack because the heart is not getting enough oxygen.  Heart disease and peripheral blood vessel disease.  High blood pressure (hypertension).  Stroke.  Oral cancer, including cancer of the lip, mouth, or voice box.  Bladder cancer.  Pancreatic cancer.  Cervical cancer.  Pregnancy complications, including premature birth.  Stillbirths and smaller newborn babies, birth defects, and genetic damage to sperm.  Early menopause.  Lower estrogen level for women.  Infertility.  Facial wrinkles.  Blindness.  Increased risk of broken bones (fractures).  Senile dementia.  Stomach ulcers and internal bleeding.  Delayed wound healing and increased risk of complications during surgery.  Even smoking lightly shortens your life expectancy  by several years.  Because of secondhand smoke exposure, children of smokers have an increased risk of the following:  Sudden infant death syndrome (SIDS).  Respiratory infections.  Lung cancer.  Heart disease.  Ear infections.  What are the benefits of quitting? There are  many health benefits of quitting smoking. Here are some of them:  Within days of quitting smoking, your risk of having a heart attack decreases, your blood flow improves, and your lung capacity improves. Blood pressure, pulse rate, and breathing patterns start returning to normal soon after quitting.  Within months, your lungs may clear up completely.  Quitting for 10 years reduces your risk of developing lung cancer and heart disease to almost that of a nonsmoker.  People who quit may see an improvement in their overall quality of life.  How do I quit smoking? Smoking is an addiction with both physical and psychological effects, and longtime habits can be hard to change. Your health care provider can recommend:  Programs and community resources, which may include group support, education, or talk therapy.  Prescription medicines to help reduce cravings.  Nicotine replacement products, such as patches, gum, and nasal sprays. Use these products only as directed. Do not replace cigarette smoking with electronic cigarettes, which are commonly called e-cigarettes. The safety of e-cigarettes is not known, and some may contain harmful chemicals.  A combination of two or more of these methods.  Where to find more information:  American Lung Association: www.lung.org  American Cancer Society: www.cancer.org Summary  Smoking cigarettes is very bad for your health. Cigarette smokers have an increased risk of many serious medical problems, including several cancers, heart disease, and stroke.  Smoking is an addiction with both physical and psychological effects, and longtime habits can be hard to change.  By stopping right away, you can greatly reduce the risk of medical problems for you and your family.  To help you quit smoking, your health care provider can recommend programs, community resources, prescription medicines, and nicotine replacement products such as patches, gum, and nasal  sprays. This information is not intended to replace advice given to you by your health care provider. Make sure you discuss any questions you have with your health care provider. Document Released: 06/26/2004 Document Revised: 05/23/2016 Document Reviewed: 05/23/2016 Elsevier Interactive Patient Education  2017 [employer].  Td Vaccine (Tetanus and Diphtheria): What You Need to Know 1. Why get vaccinated? Tetanus  and diphtheria are very serious diseases. They are rare in the [place] today, but people who do become infected often have severe complications. Td vaccine is used to protect adolescents and adults from both of these diseases. Both tetanus and diphtheria are infections caused by bacteria. Diphtheria spreads from person to person through coughing or sneezing. Tetanus-causing bacteria enter the body through cuts, scratches, or wounds. TETANUS (lockjaw) causes painful muscle tightening and stiffness, usually all over the body.  It can lead to tightening of muscles in the head and neck so you can't open your mouth, swallow, or sometimes even breathe. Tetanus kills about 1 out of every 10 people who are infected even after receiving the best medical care.  DIPHTHERIA can cause a thick coating to form in the back of the throat.  It can lead to breathing problems, paralysis, heart failure, and death.  Before vaccines, as many as 200,000 cases of diphtheria and hundreds of cases of tetanus were reported in the [place] each year. Since vaccination began, reports of cases for both diseases  have dropped by about 99%. 2. Td vaccine Td vaccine can protect adolescents and adults from tetanus and diphtheria. Td is usually given as a booster dose every 10 years but it can also be given earlier after a severe and dirty wound or burn. Another vaccine, called Tdap, which protects against pertussis in addition to tetanus and diphtheria, is sometimes recommended instead of Td vaccine. Your  doctor or the person giving you the vaccine can give you more information. Td may safely be given at the same time as other vaccines. 3. Some people should not get this vaccine  A person who has ever had a life-threatening allergic reaction after a previous dose of any tetanus or diphtheria containing vaccine, OR has a severe allergy to any part of this vaccine, should not get Td vaccine. Tell the person giving the vaccine about any severe allergies.  Talk to your doctor if you: ? had severe pain or swelling after any vaccine containing diphtheria or tetanus, ? ever had a condition called Guillain Barre Syndrome (GBS), ? aren't feeling well on the day the shot is scheduled. 4. What are the risks from Td vaccine? With any medicine, including vaccines, there is a chance of side effects. These are usually mild and go away on their own. Serious reactions are also possible but are rare. Most people who get Td vaccine do not have any problems with it. Mild problems following Td vaccine: (Did not interfere with activities)  Pain where the shot was given (about 8 people in 10)  Redness or swelling where the shot was given (about 1 person in 4)  Mild fever (rare)  Headache (about 1 person in 4)  Tiredness (about 1 person in 4)  Moderate problems following Td vaccine: (Interfered with activities, but did not require medical attention)  Fever over 102F (rare)  Severe problems following Td vaccine: (Unable to perform usual activities; required medical attention)  Swelling, severe pain, bleeding and/or redness in the arm where the shot was given (rare).  Problems that could happen after any vaccine:  People sometimes faint after a medical procedure, including vaccination. Sitting or lying down for about 15 minutes can help prevent fainting, and injuries caused by a fall. Tell your doctor if you feel dizzy, or have vision changes or ringing in the ears.  Some people get severe pain in the  shoulder and have difficulty moving the arm where a shot was given. This happens very rarely.  Any medication can cause a severe allergic reaction. Such reactions from a vaccine are very rare, estimated at fewer than 1 in a million doses, and would happen within a few minutes to a few hours after the vaccination. As with any medicine, there is a very remote chance of a vaccine causing a serious injury or death. The safety of vaccines is always being monitored. For more information, visit: http://floyd.org/www.cdc.gov/vaccinesafety/ 5. What if there is a serious reaction? What should I look for? Look for anything that concerns you, such as signs of a severe allergic reaction, very high fever, or unusual behavior. Signs of a severe allergic reaction can include hives, swelling of the face and throat, difficulty breathing, a fast heartbeat, dizziness, and weakness. These would usually start a few minutes to a few hours after the vaccination. What should I do?  If you think it is a severe allergic reaction or other emergency that can't wait, call 9-1-1 or get the person to the nearest hospital. Otherwise, call your doctor.  Afterward,  the reaction should be reported to the Vaccine Adverse Event Reporting System (VAERS). Your doctor might file this report, or you can do it yourself through the VAERS web site at www.vaers.LAgents.no, or by calling 1-3313133298. ? VAERS does not give medical advice. 6. The National Vaccine Injury Compensation Program The [employer] Vaccine Injury Compensation Program (VICP) is a federal program that was created to compensate people who may have been injured by certain vaccines. Persons who believe they may have been injured by a vaccine can learn about the program and about filing a claim by calling 1-8568741411 or visiting the VICP website at SpiritualWord.at. There is a time limit to file a claim for compensation. 7. How can I learn more?  Ask your doctor. He or she can  give you the vaccine package insert or suggest other sources of information.  Call your local or state health department.  Contact the Centers for Disease Control and Prevention (CDC): ? Call 404-507-8338 (1-800-CDC-INFO) ? Visit CDC's website at PicCapture.uy CDC Td Vaccine VIS (09/11/15) This information is not intended to replace advice given to you by your health care provider. Make sure you discuss any questions you have with your health care provider. Document Released: 03/16/2006 Document Revised: 02/07/2016 Document Reviewed: 02/07/2016 Elsevier Interactive Patient Education  2017 [employer].

## 2018-04-14 NOTE — Progress Notes (Signed)
Subjective:    Patient ID: Edward Everett, male    DOB: 1985-06-29, 32 y.o.   MRN: [id number]  HPI        32 year old male who presents as a new patient to establish care.  Patient denies any significant past medical history.  Patient states that he has had a productive cough of yellow and green sputum for about a week.  Patient denies any fever or chills.  Patient does have nasal congestion with some postnasal drainage.  Patient reports that he works at a [employer] and patient is often in and he at work area.  Patient thinks that this contributes to his recurrent issues with nasal congestion.  Patient denies any known drug allergies.  Patient does smoke about 4 to 5 cigarettes/day and states that he drinks about 2 beers every other day.  Patient reports family history significant for his mother having diabetes.  Patient states that he is single but in a committed relationship.     Review of Systems  Constitutional: Negative for chills, fatigue and fever.  HENT: Positive for postnasal drip and rhinorrhea. Negative for sinus pressure, sinus pain, sore throat and trouble swallowing.   Respiratory: Positive for cough. Negative for shortness of breath and wheezing.   Cardiovascular: Negative for chest pain, palpitations and leg swelling.  Gastrointestinal: Negative for abdominal pain and nausea.  Genitourinary: Negative for dysuria, flank pain and frequency.  Musculoskeletal: Negative for arthralgias, back pain and gait problem.  Neurological: Negative for dizziness and headaches.       Objective:   Physical Exam BP 125/85   Pulse 72   Temp 99.2 F (37.3 C) (Oral)   Resp 16   Ht 6' (1.829 m)   Wt 273 lb 6.4 oz (124 kg)   SpO2 95%   BMI 37.08 kg/m Vital signs and nurse's notes reviewed General-well-nourished, well-developed, obese male in no acute distress.  Patient with some recurrent sniffling and has a nasal quality to his voice ENT- TMs dull bilaterally, nares with moderate edema  of the nasal turbinates with clear to gray nasal discharge, patient with posterior pharynx edema/erythema and patient with a narrow posterior airway secondary to body habitus Neck-supple, no lymphadenopathy, no thyromegaly Lungs- patient with scattered coarse breath sounds in all lung fields and occasional scattered wheeze Cardiovascular-regular rate and rhythm Abdomen-soft, nontender Back-no CVA tenderness Extremities-no edema Skin- large keloid on right earlobe and small keloid scar on left jaw        Assessment & Plan:  1. Bronchitis Patient with acute bronchitis.  Patient with abnormal lung sounds on exam and patient's oxygen saturation is decreased to 95% room air.  Discussed the need for complete smoking cessation.  Patient is encouraged to get an over-the-counter medication such as Robitussin-DM or Mucinex to help thin out secretions.  Prescription provided for azithromycin Z-Pak and Zyrtec for congestion. - azithromycin (ZITHROMAX) 250 MG tablet; Take 2 pills today then 1 pill daily for the next 4 days for treatment of bronchitis  Dispense: 6 tablet; Refill: 0 - cetirizine (ZYRTEC) 10 MG tablet; Take 1 tablet (10 mg total) by mouth daily. In the evening as needed for nasal congestion  Dispense: 30 tablet; Refill: 6  2. Tobacco use Discussed the need for patient to completely stop smoking to help with his overall health.  Patient was made aware that he can return for consultation with the clinical pharmacist regarding methods to stop smoking.  Patient was also told about the [place] quit  line to help with smoking cessation.  3. Obesity (BMI 35.0-39.9 without comorbidity) Patient with obesity which increases his risk for diabetes, fatty liver and hyperlipidemia.  Patient will have CMP and lipid panel at today's visit as he is fasting.  Patient is encouraged to follow a low carbohydrate diet and to begin a regular exercise program - Comprehensive metabolic panel - Lipid  panel  An After Visit Summary was printed and given to the patient.  Return if symptoms worsen or fail to improve.

## 2018-04-15 LAB — COMPREHENSIVE METABOLIC PANEL WITH GFR
ALT: 36 IU/L (ref 0–44)
AST: 27 IU/L (ref 0–40)
Albumin/Globulin Ratio: 1.9 (ref 1.2–2.2)
Albumin: 4.7 g/dL (ref 3.5–5.5)
Alkaline Phosphatase: 113 IU/L (ref 39–117)
BUN/Creatinine Ratio: 7 — ABNORMAL LOW (ref 9–20)
BUN: 9 mg/dL (ref 6–20)
Bilirubin Total: 0.4 mg/dL (ref 0.0–1.2)
CO2: 22 mmol/L (ref 20–29)
Calcium: 9.4 mg/dL (ref 8.7–10.2)
Chloride: 106 mmol/L (ref 96–106)
Creatinine, Ser: 1.38 mg/dL — ABNORMAL HIGH (ref 0.76–1.27)
GFR calc Af Amer: 79 mL/min/1.73
GFR calc non Af Amer: 69 mL/min/1.73
Globulin, Total: 2.5 g/dL (ref 1.5–4.5)
Glucose: 96 mg/dL (ref 65–99)
Potassium: 4.2 mmol/L (ref 3.5–5.2)
Sodium: 142 mmol/L (ref 134–144)
Total Protein: 7.2 g/dL (ref 6.0–8.5)

## 2018-04-15 LAB — LIPID PANEL
Chol/HDL Ratio: 5 ratio (ref 0.0–5.0)
Cholesterol, Total: 214 mg/dL — ABNORMAL HIGH (ref 100–199)
HDL: 43 mg/dL
LDL Calculated: 151 mg/dL — ABNORMAL HIGH (ref 0–99)
Triglycerides: 100 mg/dL (ref 0–149)
VLDL Cholesterol Cal: 20 mg/dL (ref 5–40)

## 2018-04-16 ENCOUNTER — Telehealth (INDEPENDENT_AMBULATORY_CARE_PROVIDER_SITE_OTHER): Payer: Self-pay

## 2018-04-16 NOTE — Telephone Encounter (Signed)
Patient verified DOB. He is aware that his creatinine is improved from 1.52 one year ago to now 1.38. He is aware that his bad cholesterol is 151 and the range is 0-99. Advised patient to start a low fat diet and get regular exercise; repeat lipids in 6-12, if they remain elevated he will need to start cholesterol medication. Patient expressed understanding. Maryjean Mornempestt S Briasia Flinders, CMA

## 2018-04-16 NOTE — Telephone Encounter (Signed)
-----   Message from Cain Saupeammie Fulp, MD sent at 04/15/2018  4:15 PM EST ----- Please notify patient that his complete metabolic panel shows an improvement in his creatinine which is a measurement of kidney function.  One year ago, patient's creatinine was 1.5 to and creatinine is now 1.38.  Patient with a bad cholesterol level of 151.  Patient may wish to take medication to lower his cholesterol or to start with low-fat diet and regular exercise and repeat lipids in 6 to 12 months and if lipids are still elevated, patient would need to start cholesterol medication

## 2018-07-30 ENCOUNTER — Other Ambulatory Visit: Payer: Self-pay

## 2018-07-30 ENCOUNTER — Ambulatory Visit (HOSPITAL_COMMUNITY)
Admission: EM | Admit: 2018-07-30 | Discharge: 2018-07-30 | Disposition: A | Discharge status: Home / Self Care | Payer: BLUE CROSS/BLUE SHIELD | Attending: Physician Assistant | Admitting: Physician Assistant

## 2018-07-30 ENCOUNTER — Encounter (HOSPITAL_COMMUNITY): Payer: Self-pay | Admitting: Physician Assistant

## 2018-07-30 ENCOUNTER — Telehealth (HOSPITAL_COMMUNITY): Payer: Self-pay

## 2018-07-30 DIAGNOSIS — S61411A Laceration without foreign body of right hand, initial encounter: Secondary | ICD-10-CM | POA: Diagnosis not present

## 2018-07-30 DIAGNOSIS — K0381 Cracked tooth: Secondary | ICD-10-CM

## 2018-07-30 DIAGNOSIS — K0889 Other specified disorders of teeth and supporting structures: Secondary | ICD-10-CM

## 2018-07-30 DIAGNOSIS — W269XXA Contact with unspecified sharp object(s), initial encounter: Secondary | ICD-10-CM

## 2018-07-30 MED ORDER — MELOXICAM 7.5 MG PO TABS: 7.5000 mg | ORAL_TABLET | Freq: Every day | ORAL | 0 refills | Status: DC

## 2018-07-30 MED ORDER — AMOXICILLIN-POT CLAVULANATE 875-125 MG PO TABS: 1.0000 | ORAL_TABLET | Freq: Two times a day (BID) | ORAL | 0 refills | Status: DC

## 2018-07-30 NOTE — Discharge Instructions (Signed)
3 horizontal mattress sutures placed. You can remove current dressing in 24 hours. Keep wound clean and dry. You can clean gently with soap and water. Do not soak area in water. Monitor for spreading redness, increased warmth, increased swelling, fever, follow up for reevaluation needed. Otherwise follow up in 7 days for suture removal.   Start Augmentin as directed for dental infection. Mobic for pain. Follow up with dentist for further treatment and evaluation. If experiencing swelling of the throat, trouble breathing, trouble swallowing, leaning forward to breath, drooling, go to the emergency department for further evaluation.

## 2018-07-30 NOTE — ED Provider Notes (Signed)
[facility]    CSN: [id number] Arrival date & time: 07/30/18  1841     History   Chief Complaint Chief Complaint  Patient presents with  . Extremity Laceration    HPI Edward Everett is a 33 y.o. male.   33 year old male comes in for multiple complaints.  1 day history of right upper dental pain. States has known cracked tooth. Woke up with pain today with throbbing sensation. Denies facial swelling. Denies swelling of the throat, trouble breathing, trouble swallowing, tripoding, drooling, trismus. Denies fever, chills, night sweats. Has not taken anything for the symptoms. Has not been able to follow up with dentist.  Right hand laceration to the lateral 5th MCP area. Bleeding controlled with dressing. Patient states was working when area was cut by metal. Cleaned area and dressed prior to coming in for evaluation. Denies numbness/tingling. Able to move fingers without problems. Last tetanus <5 years.      History reviewed. No pertinent past medical history.  There are no active problems to display for this patient.   Past Surgical History:  Procedure Laterality Date  . NO PAST SURGERIES         Home Medications    Prior to Admission medications   Medication Sig Start Date End Date Taking? Authorizing Provider  amoxicillin-clavulanate (AUGMENTIN) 875-125 MG tablet Take 1 tablet by mouth every 12 (twelve) hours. 07/30/18   Cathie HoopsYu, Amy V, PA-C  meloxicam (MOBIC) 7.5 MG tablet Take 1 tablet (7.5 mg total) by mouth daily. 07/30/18   Belinda FisherYu, Amy V, PA-C    Family History Family History  Problem Relation Age of Onset  . Diabetes Mother     Social History Social History   Tobacco Use  . Smoking status: Current Every Day Smoker  . Smokeless tobacco: Never Used  Substance Use Topics  . Alcohol use: Yes  . Drug use: No     Allergies   Patient has no known allergies.   Review of Systems Review of Systems  Reason unable to perform ROS: See HPI as above.      Physical Exam Triage Vital Signs ED Triage Vitals  Enc Vitals Group     BP 07/30/18 1917 (!) 148/96     Pulse Rate 07/30/18 1917 88     Resp 07/30/18 1917 16     Temp 07/30/18 1917 98.6 F (37 C)     Temp Source 07/30/18 1917 Oral     SpO2 07/30/18 1917 100 %     Weight 07/30/18 1921 265 lb (120.2 kg)     Height 07/30/18 1921 6' (1.829 m)     Head Circumference --      Peak Flow --      Pain Score 07/30/18 1920 8     Pain Loc --      Pain Edu? --      Excl. in [place]? --    No data found.  Updated Vital Signs BP (!) 148/96 (BP Location: Right Arm)   Pulse 88   Temp 98.6 F (37 C) (Oral)   Resp 16   Ht 6' (1.829 m)   Wt 265 lb (120.2 kg)   SpO2 100%   BMI 35.94 kg/m   Physical Exam Constitutional:      General: He is not in acute distress.    Appearance: He is well-developed. He is not ill-appearing, toxic-appearing or diaphoretic.  HENT:     Head: Normocephalic and atraumatic.     Jaw:  No trismus.     Mouth/Throat:     Mouth: Mucous membranes are moist.     Pharynx: Oropharynx is clear. Uvula midline. No uvula swelling.     Tonsils: No tonsillar exudate.      Comments: Tenderness to palpation of right upper gum.  Floor of mouth soft to palpation. No facial swelling.  Neck:     Musculoskeletal: Normal range of motion and neck supple.  Musculoskeletal:     Comments: 1.5cm laceration to the right lateral 5th MCP area. Bleeding controlled. Full ROM of finger and wrist. Sensation intact. Cap refill <2s.   Skin:    General: Skin is warm and dry.  Neurological:     Mental Status: He is alert and oriented to person, place, and time.      UC Treatments / Results  Labs (all labs ordered are listed, but only abnormal results are displayed) Labs Reviewed - No data to display  EKG None  Radiology No results found.  Procedures Laceration Repair Date/Time: 07/30/2018 8:39 PM Performed by: Belinda Fisher, PA-C Authorized by: Belinda Fisher, PA-C   Consent:     Consent obtained:  Verbal   Consent given by:  Patient   Risks discussed:  Pain, infection, poor cosmetic result and poor wound healing   Alternatives discussed:  No treatment and referral Anesthesia (see MAR for exact dosages):    Anesthesia method:  Local infiltration   Local anesthetic:  Lidocaine 2% w/o epi Laceration details:    Location:  Hand   Hand location: right lateral hand.   Length (cm):  1.5   Depth (mm):  2 Repair type:    Repair type:  Simple Pre-procedure details:    Preparation:  Patient was prepped and draped in usual sterile fashion Exploration:    Hemostasis achieved with:  Epinephrine and direct pressure   Wound exploration: wound explored through full range of motion and entire depth of wound probed and visualized   Treatment:    Area cleansed with:  Hibiclens   Amount of cleaning:  Standard   Irrigation solution:  Sterile saline   Irrigation method:  Pressure wash   Visualized foreign bodies/material removed: no   Skin repair:    Repair method:  Sutures   Suture size:  5-0   Suture material:  Prolene   Suture technique:  Horizontal mattress   Number of sutures:  3 Approximation:    Approximation:  Close Post-procedure details:    Dressing:  Antibiotic ointment and bulky dressing   Patient tolerance of procedure:  Tolerated well, no immediate complications   (including critical care time)  Medications Ordered in UC Medications - No data to display  Initial Impression / Assessment and Plan / UC Course  I have reviewed the triage vital signs and the nursing notes.  Pertinent labs & imaging results that were available during my care of the patient were reviewed by me and considered in my medical decision making (see chart for details).    Patient tolerated procedure well. 3 horizontal mattress sutures placed. Wound care instructions given. Return precautions given. Otherwise, follow up in 7 days for suture removal. Patient expresses understanding  and agrees to plan.   Start antibiotics for possible dental infection. Symptomatic treatment as needed. Discussed with patient symptoms can return if dental problem is not addressed. Follow up with dentist for further evaluation and treatment of dental pain. Resources given. Return precautions given.    Final Clinical Impressions(s) / UC Diagnoses  Final diagnoses:  Laceration of right hand without foreign body, initial encounter  Pain, dental    ED Prescriptions    Medication Sig Dispense Auth. Provider   amoxicillin-clavulanate (AUGMENTIN) 875-125 MG tablet Take 1 tablet by mouth every 12 (twelve) hours. 14 tablet Yu, Amy V, PA-C   meloxicam (MOBIC) 7.5 MG tablet Take 1 tablet (7.5 mg total) by mouth daily. 15 tablet Threasa Alpha, [place] 07/30/18 2041

## 2018-07-30 NOTE — ED Triage Notes (Signed)
Per pt he cut his right pinky finger lower area at home this morning and also co of dental pain on the right upper jaw teeth. Pt says it has a heartbeat in his upper gum area.

## 2018-11-19 IMAGING — DX DG CHEST 2V
2 series · 2 of 2 positions shown · non-contrast
Comparison: None.

CLINICAL DATA: Acute onset of generalized body aches and nasal
congestion. Diarrhea and cough. Initial encounter.

EXAM:
CHEST  2 VIEW

[chest pa]
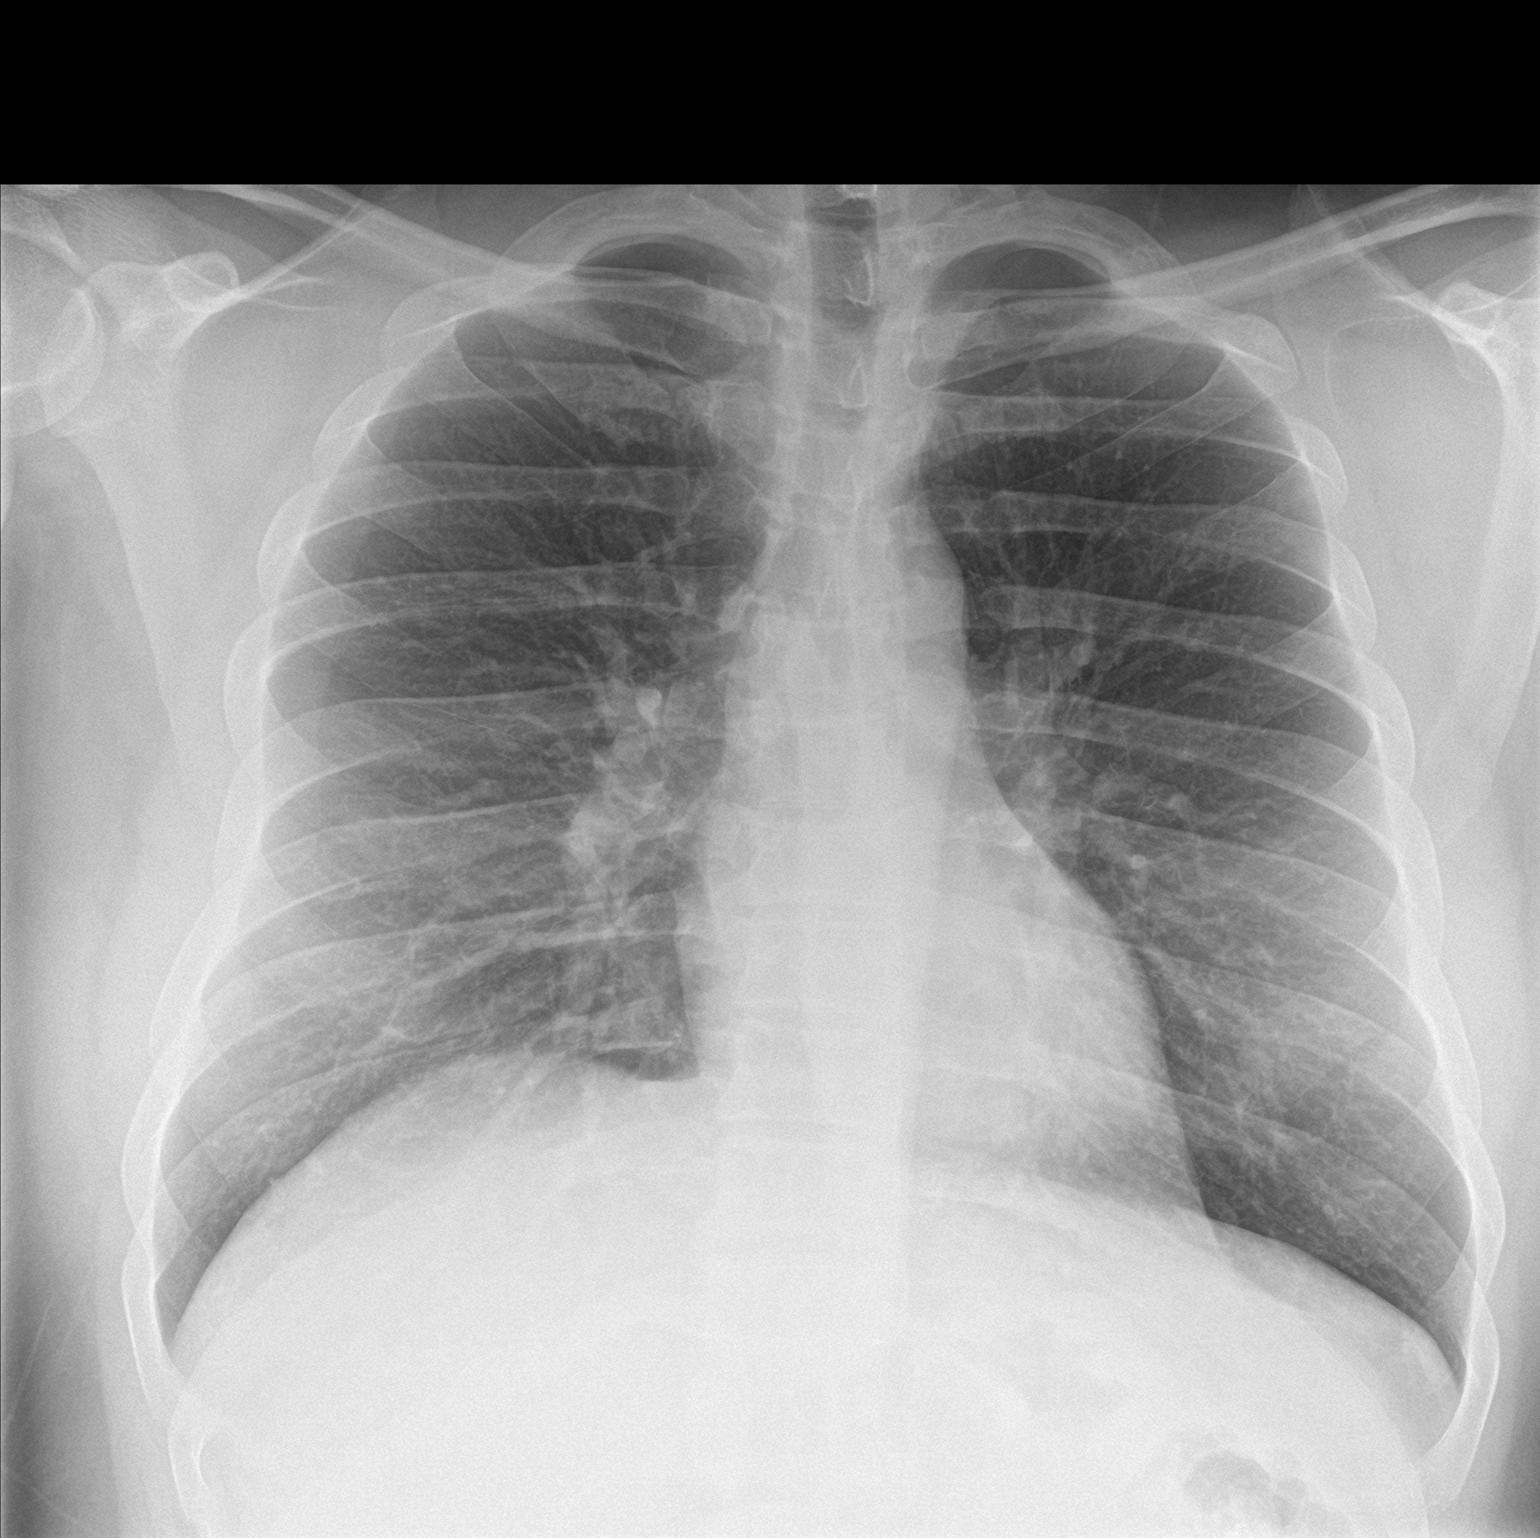

[chest lat]
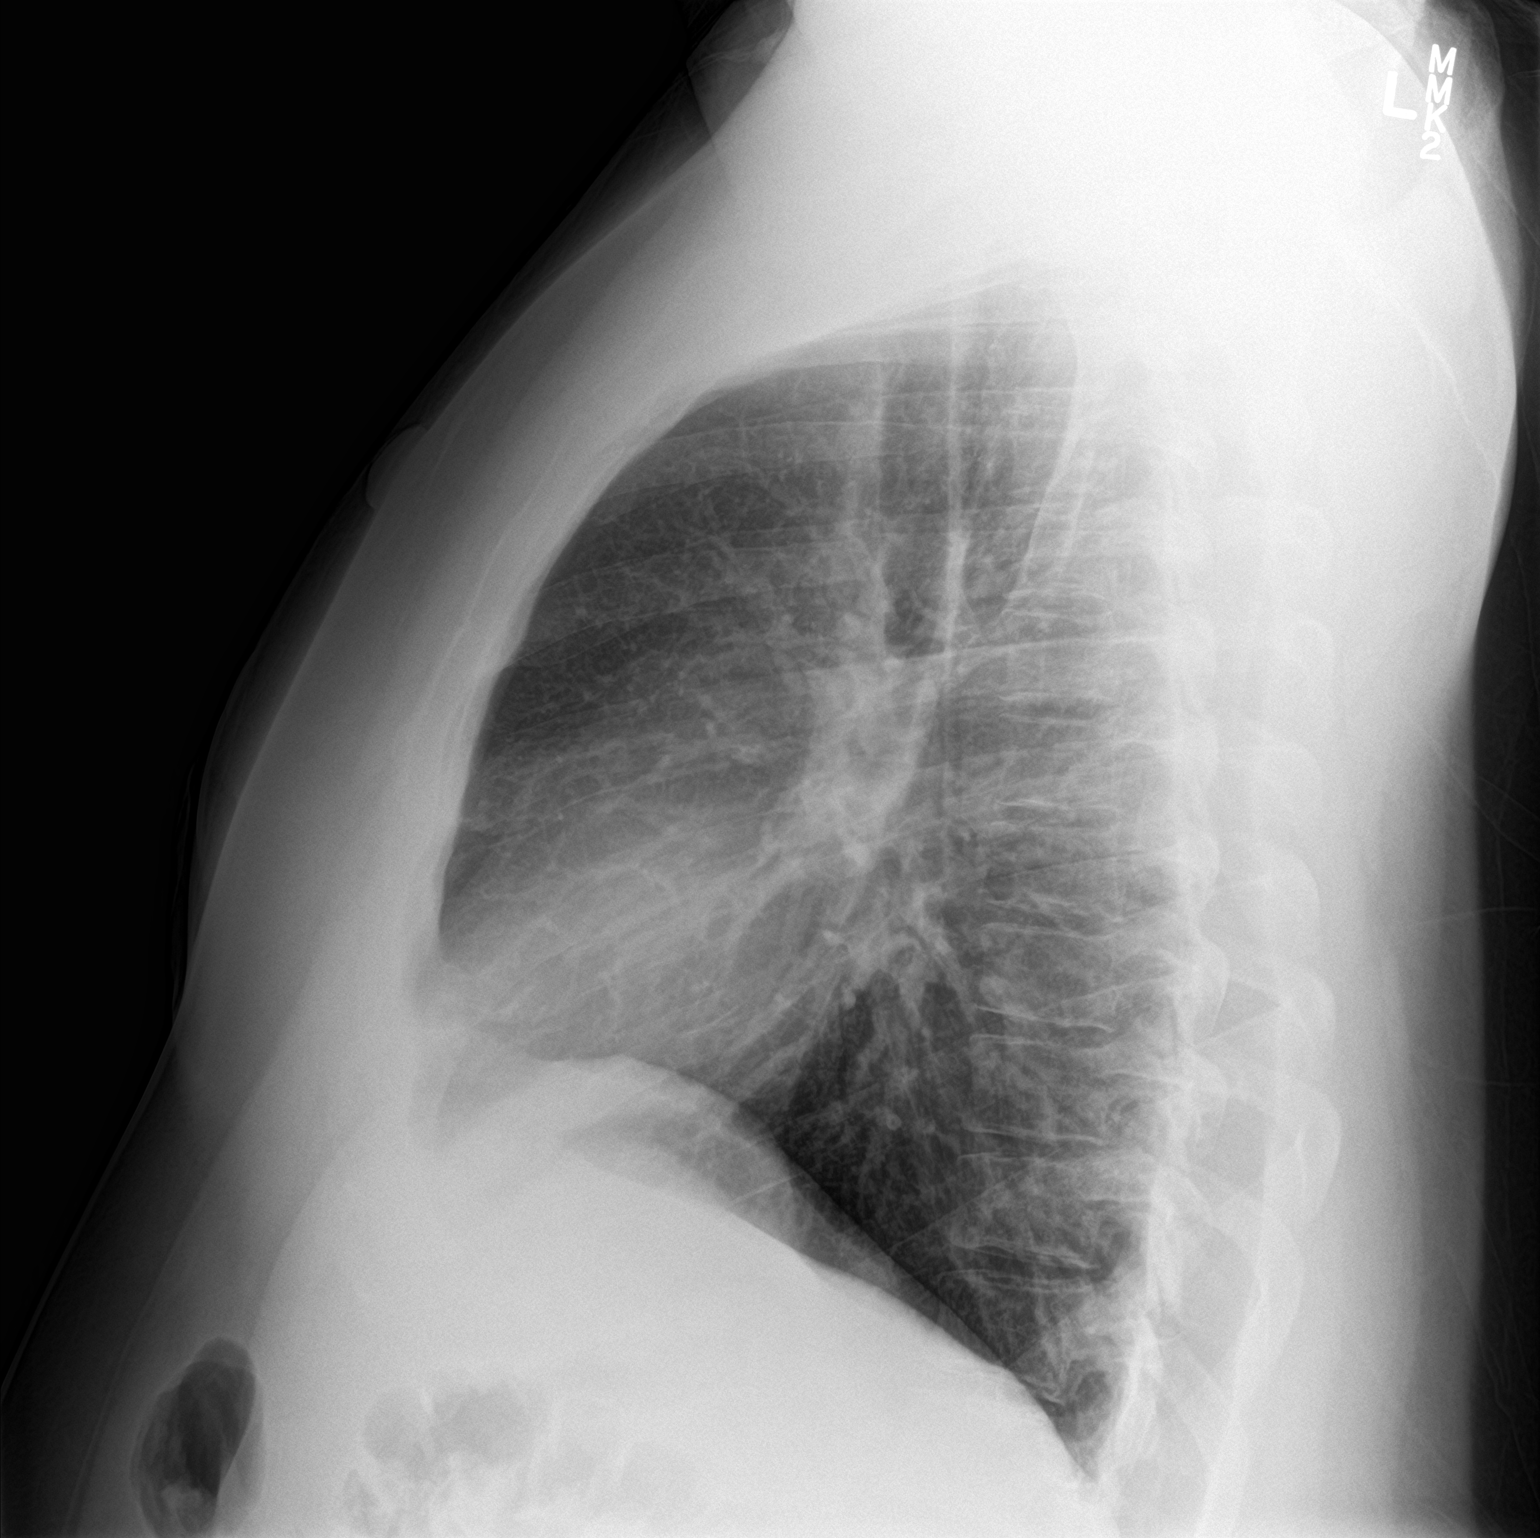

[2 of 2 positions shown; findings below may reference images not displayed]

FINDINGS: The lungs are well-aerated. Pulmonary vascularity is at the upper
limits of normal. There is no evidence of focal opacification,
pleural effusion or pneumothorax.

The heart is normal in size; the mediastinal contour is within
normal limits. No acute osseous abnormalities are seen.
IMPRESSION: No acute cardiopulmonary process seen.

## 2019-01-21 ENCOUNTER — Other Ambulatory Visit: Payer: Self-pay

## 2019-01-21 DIAGNOSIS — Z20822 Contact with and (suspected) exposure to covid-19: Secondary | ICD-10-CM

## 2019-01-22 LAB — NOVEL CORONAVIRUS, NAA: SARS-CoV-2, NAA: NOT DETECTED

## 2019-06-16 ENCOUNTER — Other Ambulatory Visit: Payer: Self-pay

## 2019-06-16 ENCOUNTER — Encounter (HOSPITAL_COMMUNITY): Payer: Self-pay

## 2019-06-16 ENCOUNTER — Ambulatory Visit (HOSPITAL_COMMUNITY)
Admission: EM | Admit: 2019-06-16 | Discharge: 2019-06-16 | Disposition: A | Discharge status: Home / Self Care | Payer: BC Managed Care – PPO | Attending: Family Medicine | Admitting: Family Medicine

## 2019-06-16 DIAGNOSIS — Z20822 Contact with and (suspected) exposure to covid-19: Secondary | ICD-10-CM | POA: Insufficient documentation

## 2019-06-16 DIAGNOSIS — R112 Nausea with vomiting, unspecified: Secondary | ICD-10-CM

## 2019-06-16 DIAGNOSIS — J069 Acute upper respiratory infection, unspecified: Secondary | ICD-10-CM | POA: Diagnosis present

## 2019-06-16 LAB — POC SARS CORONAVIRUS 2 AG: SARS Coronavirus 2 Ag: NEGATIVE

## 2019-06-16 LAB — POC SARS CORONAVIRUS 2 AG -  ED: SARS Coronavirus 2 Ag: NEGATIVE

## 2019-06-16 MED ORDER — BENZONATATE 200 MG PO CAPS: 200.0000 mg | ORAL_CAPSULE | Freq: Two times a day (BID) | ORAL | 0 refills | Status: DC | PRN

## 2019-06-16 MED ORDER — ONDANSETRON 8 MG PO TBDP: 8.0000 mg | ORAL_TABLET | Freq: Three times a day (TID) | ORAL | 0 refills | Status: DC | PRN

## 2019-06-16 NOTE — ED Triage Notes (Signed)
Pt presents with productive cough, vomiting, and diarrhea since yesterday with no relief with OTC medication.

## 2019-06-16 NOTE — Discharge Instructions (Signed)
Go home to rest Drink plenty of fluids Take Tylenol for pain or fever Take Tessalon for cough as needed Take Zofran for nausea and vomiting as needed You may take over-the-counter cough and cold medicines as needed You must quarantine at home until your test result is available You can check for your test result in MyChart

## 2019-06-16 NOTE — ED Provider Notes (Signed)
[facility]    CSN: [id number] Arrival date & time: 06/16/19  1830      History   Chief Complaint Chief Complaint  Patient presents with  . Cough  . Vomiting  . Diarrhea    HPI Edward Everett is a 34 y.o. male.   HPI  Patient has had nausea vomiting diarrhea and cough since yesterday.  He tried over-the-counter medications.  He is got no relief.  He states that he vomited once last night and once this morning.  2 loose bowel movements this morning.  Coughing has been all day long.  No body aches.  No loss of taste or smell.  No headache.  No fever or chills.  No known exposure to coronavirus.  Lives at home with girlfriend and 4 children.  No one else is sick  History reviewed. No pertinent past medical history.  There are no problems to display for this patient.   Past Surgical History:  Procedure Laterality Date  . NO PAST SURGERIES         Home Medications    Prior to Admission medications   Medication Sig Start Date End Date Taking? Authorizing Provider  benzonatate (TESSALON) 200 MG capsule Take 1 capsule (200 mg total) by mouth 2 (two) times daily as needed for cough. 06/16/19   Raylene Everts, MD  ondansetron (ZOFRAN ODT) 8 MG disintegrating tablet Take 1 tablet (8 mg total) by mouth every 8 (eight) hours as needed for nausea or vomiting. 06/16/19   Raylene Everts, MD    Family History Family History  Problem Relation Age of Onset  . Diabetes Mother     Social History Social History   Tobacco Use  . Smoking status: Current Every Day Smoker  . Smokeless tobacco: Never Used  Substance Use Topics  . Alcohol use: Yes  . Drug use: No     Allergies   Patient has no known allergies.   Review of Systems Review of Systems  Constitutional: Positive for fatigue. Negative for chills and fever.  HENT: Negative for congestion and sore throat.   Respiratory: Positive for cough. Negative for shortness of breath.   Gastrointestinal:  Positive for diarrhea, nausea and vomiting. Negative for abdominal pain.  Musculoskeletal: Negative for back pain and myalgias.  Neurological: Negative for headaches.     Physical Exam Triage Vital Signs ED Triage Vitals  Enc Vitals Group     BP 06/16/19 1927 (!) 148/96     Pulse Rate 06/16/19 1927 98     Resp 06/16/19 1927 20     Temp 06/16/19 1927 99.8 F (37.7 C)     Temp Source 06/16/19 1927 Oral     SpO2 06/16/19 1927 100 %     Weight --      Height --      Head Circumference --      Peak Flow --      Pain Score 06/16/19 1925 5     Pain Loc --      Pain Edu? --      Excl. in [place]? --    No data found.  Updated Vital Signs BP (!) 148/96 (BP Location: Left Arm)   Pulse 98   Temp 99.8 F (37.7 C) (Oral) Comment: took mucinex at noon  Resp 20   SpO2 100%     Physical Exam Constitutional:      General: He is not in acute distress.    Appearance: He is well-developed.  He is obese. He is ill-appearing.     Comments: Appears tired  HENT:     Head: Normocephalic and atraumatic.     Mouth/Throat:     Comments: Mask in place Eyes:     Conjunctiva/sclera: Conjunctivae normal.     Pupils: Pupils are equal, round, and reactive to light.  Cardiovascular:     Rate and Rhythm: Normal rate and regular rhythm.  Pulmonary:     Effort: Pulmonary effort is normal. No respiratory distress.     Breath sounds: Normal breath sounds.  Abdominal:     General: There is no distension.     Palpations: Abdomen is soft.     Tenderness: There is no abdominal tenderness.  Musculoskeletal:        General: Normal range of motion.     Cervical back: Normal range of motion.  Skin:    General: Skin is warm and dry.  Neurological:     Mental Status: He is alert.  Psychiatric:        Mood and Affect: Mood normal.        Behavior: Behavior normal.      UC Treatments / Results  Labs (all labs ordered are listed, but only abnormal results are displayed) Labs Reviewed  NOVEL  CORONAVIRUS, NAA (HOSP ORDER, SEND-OUT TO REF LAB; TAT 18-24 HRS)  POC SARS CORONAVIRUS 2 AG -  ED  POC SARS CORONAVIRUS 2 AG    EKG   Radiology No results found.  Procedures Procedures (including critical care time)  Medications Ordered in UC Medications - No data to display  Initial Impression / Assessment and Plan / UC Course  I have reviewed the triage vital signs and the nursing notes.  Pertinent labs & imaging results that were available during my care of the patient were reviewed by me and considered in my medical decision making (see chart for details).     Discussed the importance of quarantine while he is waiting for his test results.  Reviewed symptomatic care. Final Clinical Impressions(s) / UC Diagnoses   Final diagnoses:  Viral URI with cough  Suspected COVID-19 virus infection     Discharge Instructions     Go home to rest Drink plenty of fluids Take Tylenol for pain or fever Take Tessalon for cough as needed Take Zofran for nausea and vomiting as needed You may take over-the-counter cough and cold medicines as needed You must quarantine at home until your test result is available You can check for your test result in MyChart    ED Prescriptions    Medication Sig Dispense Auth. Provider   ondansetron (ZOFRAN ODT) 8 MG disintegrating tablet Take 1 tablet (8 mg total) by mouth every 8 (eight) hours as needed for nausea or vomiting. 20 tablet Eustace Moore, MD   benzonatate (TESSALON) 200 MG capsule Take 1 capsule (200 mg total) by mouth 2 (two) times daily as needed for cough. 20 capsule Eustace Moore, MD     PDMP not reviewed this encounter.   Eustace Moore, MD 06/16/19 2011

## 2019-06-19 LAB — NOVEL CORONAVIRUS, NAA (HOSP ORDER, SEND-OUT TO REF LAB; TAT 18-24 HRS): SARS-CoV-2, NAA: NOT DETECTED

## 2019-08-28 ENCOUNTER — Ambulatory Visit (HOSPITAL_COMMUNITY)
Admission: EM | Admit: 2019-08-28 | Discharge: 2019-08-28 | Disposition: A | Discharge status: Home / Self Care | Payer: BC Managed Care – PPO | Attending: Urgent Care | Admitting: Urgent Care

## 2019-08-28 ENCOUNTER — Encounter (HOSPITAL_COMMUNITY): Payer: Self-pay

## 2019-08-28 ENCOUNTER — Other Ambulatory Visit: Payer: Self-pay

## 2019-08-28 DIAGNOSIS — K047 Periapical abscess without sinus: Secondary | ICD-10-CM | POA: Diagnosis not present

## 2019-08-28 DIAGNOSIS — K0889 Other specified disorders of teeth and supporting structures: Secondary | ICD-10-CM

## 2019-08-28 MED ORDER — LIDOCAINE HCL (PF) 1 % IJ SOLN
INTRAMUSCULAR | Status: AC
  Filled 2019-08-28: qty 2

## 2019-08-28 MED ORDER — CEFTRIAXONE SODIUM 1 G IJ SOLR
INTRAMUSCULAR | Status: AC
  Filled 2019-08-28: qty 10

## 2019-08-28 MED ORDER — KETOROLAC TROMETHAMINE 60 MG/2ML IM SOLN
60.0000 mg | Freq: Once | INTRAMUSCULAR | Status: AC
  Administered 2019-08-28: 11:00:00 60 mg via INTRAMUSCULAR

## 2019-08-28 MED ORDER — KETOROLAC TROMETHAMINE 60 MG/2ML IM SOLN
INTRAMUSCULAR | Status: AC
  Filled 2019-08-28: qty 2

## 2019-08-28 MED ORDER — HYDROCODONE-ACETAMINOPHEN 5-325 MG PO TABS: 1.0000 | ORAL_TABLET | Freq: Four times a day (QID) | ORAL | 0 refills | Status: DC | PRN

## 2019-08-28 MED ORDER — NAPROXEN 500 MG PO TABS: 500.0000 mg | ORAL_TABLET | Freq: Two times a day (BID) | ORAL | 0 refills | Status: DC

## 2019-08-28 MED ORDER — AMOXICILLIN-POT CLAVULANATE 875-125 MG PO TABS: 1.0000 | ORAL_TABLET | Freq: Two times a day (BID) | ORAL | 0 refills | Status: DC

## 2019-08-28 MED ORDER — CEFTRIAXONE SODIUM 1 G IJ SOLR
1.0000 g | Freq: Once | INTRAMUSCULAR | Status: AC
  Administered 2019-08-28: 1 g via INTRAMUSCULAR

## 2019-08-28 NOTE — ED Triage Notes (Signed)
Pt states she has a dental  pain and abscess started Friday. Swelling and pain started last night.

## 2019-08-28 NOTE — ED Provider Notes (Signed)
[facility]   MRN: [id number] DOB: 16-Oct-1985  Subjective:   Edward Everett is a 34 y.o. male presenting for 3-day history acute onset recurrent left upper sided dental pain that is severe and not responding to Aleve.  He has had significant and worsening swelling, difficulty opening his mouth.  This morning, started feeling pain and pressure around his eye.  He did call the dental practice and tried to set up an appointment but did not have any luck.  He plans on trying again tomorrow.  No current facility-administered medications for this encounter.  Current Outpatient Medications:  .  benzonatate (TESSALON) 200 MG capsule, Take 1 capsule (200 mg total) by mouth 2 (two) times daily as needed for cough. (Patient not taking: Reported on 08/28/2019), Disp: 20 capsule, Rfl: 0 .  ondansetron (ZOFRAN ODT) 8 MG disintegrating tablet, Take 1 tablet (8 mg total) by mouth every 8 (eight) hours as needed for nausea or vomiting. (Patient not taking: Reported on 08/28/2019), Disp: 20 tablet, Rfl: 0   No Known Allergies  History reviewed. No pertinent past medical history.   Past Surgical History:  Procedure Laterality Date  . NO PAST SURGERIES      Family History  Problem Relation Age of Onset  . Diabetes Mother     Social History   Tobacco Use  . Smoking status: Current Every Day Smoker  . Smokeless tobacco: Never Used  Substance Use Topics  . Alcohol use: Yes  . Drug use: No    ROS   Objective:   Vitals: BP 135/90 (BP Location: Right Arm)   Pulse 86   Temp 98.5 F (36.9 C) (Oral)   Resp 18   Wt 282 lb (127.9 kg)   SpO2 98%   BMI 38.25 kg/m   Physical Exam Constitutional:      General: He is not in acute distress.    Appearance: Normal appearance. He is well-developed and normal weight. He is not ill-appearing, toxic-appearing or diaphoretic.  HENT:     Head: Normocephalic and atraumatic.      Right Ear: External ear normal. There is no impacted cerumen.     Left Ear: External ear normal. There is no impacted cerumen.     Nose: Nose normal.     Mouth/Throat:     Pharynx: Oropharynx is clear.   Eyes:     General: No scleral icterus.       Right eye: No discharge.        Left eye: No discharge.     Extraocular Movements: Extraocular movements intact.     Pupils: Pupils are equal, round, and reactive to light.  Cardiovascular:     Rate and Rhythm: Normal rate.  Pulmonary:     Effort: Pulmonary effort is normal.  Musculoskeletal:     Cervical back: Normal range of motion.  Neurological:     Mental Status: He is alert and oriented to person, place, and time.  Psychiatric:        Mood and Affect: Mood normal.        Behavior: Behavior normal.        Thought Content: Thought content normal.        Judgment: Judgment normal.      Assessment and Plan :   1. Dental abscess   2. Pain, dental     IM ceftriaxone in clinic, IM Toradol.  Recommended patient start Augmentin, use naproxen for pain control.  Use hydrocodone for breakthrough pain.  Information  provided to several dental practices for patient to obtain a urgent dental consult.  Strict ER precautions. Counseled patient on potential for adverse effects with medications prescribed today, patient verbalized understanding.    Jaynee Eagles, PA-C 08/28/19 1105

## 2019-08-28 NOTE — Discharge Instructions (Signed)
[facility] [phone] extension [phone]  [facility] Civils [phone] [address]  [facility] [phone] [address]  [facility] [phone] extension [phone] [place], [place], [address] First come first serve for the first 10 clients.  May do simple extractions only, no wisdom teeth or surgery.  You may try the second for Thursday of the month starting at 6:30 AM.  [employer] You may call the school to see if they are still helping to provide dental care for emergent cases.  

## 2019-10-10 ENCOUNTER — Ambulatory Visit (HOSPITAL_COMMUNITY)
Admission: EM | Admit: 2019-10-10 | Discharge: 2019-10-10 | Disposition: A | Discharge status: Home / Self Care | Payer: BC Managed Care – PPO | Attending: Emergency Medicine | Admitting: Emergency Medicine

## 2019-10-10 ENCOUNTER — Encounter (HOSPITAL_COMMUNITY): Payer: Self-pay

## 2019-10-10 ENCOUNTER — Other Ambulatory Visit: Payer: Self-pay

## 2019-10-10 DIAGNOSIS — L0211 Cutaneous abscess of neck: Secondary | ICD-10-CM

## 2019-10-10 MED ORDER — DOXYCYCLINE HYCLATE 100 MG PO CAPS: 100.0000 mg | ORAL_CAPSULE | Freq: Two times a day (BID) | ORAL | 0 refills | Status: AC

## 2019-10-10 MED ORDER — IBUPROFEN 800 MG PO TABS: 800.0000 mg | ORAL_TABLET | Freq: Three times a day (TID) | ORAL | 0 refills | Status: DC

## 2019-10-10 NOTE — ED Triage Notes (Signed)
Pt presents with abscess on left side of face X 1 week.

## 2019-10-10 NOTE — Discharge Instructions (Signed)
Please begin doxycycline for 10 days ° °Apply warm compresses/hot rags to area with massage to express further drainage especially the first 24-48 hours ° °Return if symptoms returning or not improving  °

## 2019-10-10 NOTE — ED Provider Notes (Signed)
[place]    CSN: [id number] Arrival date & time: 10/10/19  1826      History   Chief Complaint Chief Complaint  Patient presents with  . Abscess    HPI Edward Everett is a 34 y.o. male no significant past medical history presenting today for evaluation of an abscess.  Patient notes that over the past week he has developed an area of swelling to his left neck.  Reports he had some drainage last night with running warm water over it.  He denies fevers.  Denies neck stiffness.  Reports he develops keloids easily, but denies significant history of abscesses.  Does report he went to the barber approximately 2 weeks prior to today.  HPI  History reviewed. No pertinent past medical history.  There are no problems to display for this patient.   Past Surgical History:  Procedure Laterality Date  . NO PAST SURGERIES         Home Medications    Prior to Admission medications   Medication Sig Start Date End Date Taking? Authorizing Provider  benzonatate (TESSALON) 200 MG capsule Take 1 capsule (200 mg total) by mouth 2 (two) times daily as needed for cough. Patient not taking: Reported on 08/28/2019 06/16/19   Raylene Everts, MD  doxycycline (VIBRAMYCIN) 100 MG capsule Take 1 capsule (100 mg total) by mouth 2 (two) times daily for 10 days. 10/10/19 10/20/19  Emelio Schneller C, PA-C  HYDROcodone-acetaminophen (NORCO/VICODIN) 5-325 MG tablet Take 1 tablet by mouth every 6 (six) hours as needed for severe pain. 08/28/19   Jaynee Eagles, PA-C  ibuprofen (ADVIL) 800 MG tablet Take 1 tablet (800 mg total) by mouth 3 (three) times daily. 10/10/19   Waller Marcussen C, PA-C  naproxen (NAPROSYN) 500 MG tablet Take 1 tablet (500 mg total) by mouth 2 (two) times daily. 08/28/19   Jaynee Eagles, PA-C  ondansetron (ZOFRAN ODT) 8 MG disintegrating tablet Take 1 tablet (8 mg total) by mouth every 8 (eight) hours as needed for nausea or vomiting. Patient not taking: Reported on 08/28/2019  06/16/19   Raylene Everts, MD    Family History Family History  Problem Relation Age of Onset  . Diabetes Mother     Social History Social History   Tobacco Use  . Smoking status: Current Every Day Smoker  . Smokeless tobacco: Never Used  Substance Use Topics  . Alcohol use: Yes  . Drug use: No     Allergies   Patient has no known allergies.   Review of Systems Review of Systems  Constitutional: Negative for fatigue and fever.  Eyes: Negative for redness, itching and visual disturbance.  Respiratory: Negative for shortness of breath.   Cardiovascular: Negative for chest pain and leg swelling.  Gastrointestinal: Negative for nausea and vomiting.  Musculoskeletal: Positive for neck pain. Negative for arthralgias and myalgias.  Skin: Positive for color change. Negative for rash and wound.  Neurological: Negative for dizziness, syncope, weakness, light-headedness and headaches.     Physical Exam Triage Vital Signs ED Triage Vitals  Enc Vitals Group     BP 10/10/19 1918 (!) 140/96     Pulse Rate 10/10/19 1918 99     Resp 10/10/19 1918 20     Temp --      Temp src --      SpO2 10/10/19 1918 99 %     Weight --      Height --      Head Circumference --  Peak Flow --      Pain Score 10/10/19 1917 9     Pain Loc --      Pain Edu? --      Excl. in GC? --    No data found.  Updated Vital Signs BP (!) 140/96 (BP Location: Right Arm)   Pulse 99   Resp 20   SpO2 99%   Visual Acuity Right Eye Distance:   Left Eye Distance:   Bilateral Distance:    Right Eye Near:   Left Eye Near:    Bilateral Near:     Physical Exam Vitals and nursing note reviewed.  Constitutional:      Appearance: He is well-developed.     Comments: No acute distress  HENT:     Head: Normocephalic and atraumatic.     Nose: Nose normal.  Eyes:     Conjunctiva/sclera: Conjunctivae normal.  Neck:     Comments: Left anterior neck with 3 cm x 3 cm circular area of swelling with  central fluctuance, skin peeling and thinning of skin with discoloration and surrounding induration, significantly tender to touch  Full active range of motion of neck Cardiovascular:     Rate and Rhythm: Normal rate.  Pulmonary:     Effort: Pulmonary effort is normal. No respiratory distress.  Abdominal:     General: There is no distension.  Musculoskeletal:        General: Normal range of motion.     Cervical back: Neck supple.  Skin:    General: Skin is warm and dry.  Neurological:     Mental Status: He is alert and oriented to person, place, and time.      UC Treatments / Results  Labs (all labs ordered are listed, but only abnormal results are displayed) Labs Reviewed - No data to display  EKG   Radiology No results found.  Procedures Incision and Drainage  Date/Time: 10/10/2019 8:04 PM Performed by: Kathryn Cosby, [place] C, PA-C Authorized by: Mickal Meno, Junius Creamer, PA-C   Consent:    Consent obtained:  Verbal   Consent given by:  Patient   Risks discussed:  Bleeding, pain, incomplete drainage and damage to other organs Location:    Type:  Abscess   Size:  3   Location:  Neck   Neck location:  L anterior Pre-procedure details:    Skin preparation:  Betadine Anesthesia (see MAR for exact dosages):    Anesthesia method:  Local infiltration   Local anesthetic:  Lidocaine 2% WITH epi Procedure type:    Complexity:  Simple Procedure details:    Needle aspiration: no     Incision types:  Single straight   Incision depth:  Subcutaneous   Scalpel blade:  11   Wound management:  Probed and deloculated   Drainage:  Bloody and purulent   Drainage amount:  Moderate   Packing materials:  1/4 in iodoform gauze Post-procedure details:    Patient tolerance of procedure:  Tolerated well, no immediate complications   (including critical care time)  Medications Ordered in UC Medications - No data to display  Initial Impression / Assessment and Plan / UC Course  I have  reviewed the triage vital signs and the nursing notes.  Pertinent labs & imaging results that were available during my care of the patient were reviewed by me and considered in my medical decision making (see chart for details).     I&D performed abscess on neck.  Initiated on doxycycline, warm compresses  and monitor for gradual improvement.  Small amount of packing was placed and advised patient to remove in 24 to 48 hours.  Follow-up if any symptoms not improving or worsening.  Discussed strict return precautions. Patient verbalized understanding and is agreeable with plan.  Final Clinical Impressions(s) / UC Diagnoses   Final diagnoses:  Neck abscess     Discharge Instructions     Please begin doxycycline for 10 days  Apply warm compresses/hot rags to area with massage to express further drainage especially the first 24-48 hours  Return if symptoms returning or not improving    ED Prescriptions    Medication Sig Dispense Auth. Provider   doxycycline (VIBRAMYCIN) 100 MG capsule Take 1 capsule (100 mg total) by mouth 2 (two) times daily for 10 days. 20 capsule Guss Farruggia C, PA-C   ibuprofen (ADVIL) 800 MG tablet Take 1 tablet (800 mg total) by mouth 3 (three) times daily. 21 tablet Christia Domke, [place] C, PA-C     PDMP not reviewed this encounter.   Lew Dawes, [place] 10/10/19 2029

## 2021-09-03 DIAGNOSIS — Z713 Dietary counseling and surveillance: Secondary | ICD-10-CM | POA: Diagnosis not present

## 2021-12-26 DIAGNOSIS — Z713 Dietary counseling and surveillance: Secondary | ICD-10-CM | POA: Diagnosis not present

## 2022-03-03 DIAGNOSIS — Z713 Dietary counseling and surveillance: Secondary | ICD-10-CM | POA: Diagnosis not present

## 2022-06-12 ENCOUNTER — Ambulatory Visit: Payer: BC Managed Care – PPO | Attending: Family Medicine | Admitting: Family Medicine

## 2022-06-12 ENCOUNTER — Encounter: Payer: Self-pay | Admitting: Family Medicine

## 2022-06-12 VITALS — BP 136/87 | HR 91 | Temp 98.3°F | Ht 76.0 in | Wt 274.4 lb

## 2022-06-12 DIAGNOSIS — F17290 Nicotine dependence, other tobacco product, uncomplicated: Secondary | ICD-10-CM

## 2022-06-12 DIAGNOSIS — K219 Gastro-esophageal reflux disease without esophagitis: Secondary | ICD-10-CM

## 2022-06-12 DIAGNOSIS — Z131 Encounter for screening for diabetes mellitus: Secondary | ICD-10-CM | POA: Diagnosis not present

## 2022-06-12 DIAGNOSIS — Z0001 Encounter for general adult medical examination with abnormal findings: Secondary | ICD-10-CM

## 2022-06-12 DIAGNOSIS — Z13220 Encounter for screening for lipoid disorders: Secondary | ICD-10-CM | POA: Diagnosis not present

## 2022-06-12 DIAGNOSIS — Z13228 Encounter for screening for other metabolic disorders: Secondary | ICD-10-CM

## 2022-06-12 DIAGNOSIS — Z1159 Encounter for screening for other viral diseases: Secondary | ICD-10-CM

## 2022-06-12 LAB — POCT GLYCOSYLATED HEMOGLOBIN (HGB A1C): Hemoglobin A1C: 5.7 % — AB (ref 4.0–5.6)

## 2022-06-12 MED ORDER — OMEPRAZOLE 40 MG PO CPDR: 40.0000 mg | DELAYED_RELEASE_CAPSULE | Freq: Every day | ORAL | 1 refills | Status: DC

## 2022-06-12 NOTE — Addendum Note (Signed)
Addended by: Charlott Rakes on: 06/12/2022 09:26 AM   Modules accepted: Orders

## 2022-06-12 NOTE — Progress Notes (Signed)
Want physical and blood work. Screening for diabetes.

## 2022-06-12 NOTE — Patient Instructions (Signed)
Health Maintenance, Male Adopting a healthy lifestyle and getting preventive care are important in promoting health and wellness. Ask your health care provider about: The right schedule for you to have regular tests and exams. Things you can do on your own to prevent diseases and keep yourself healthy. What should I know about diet, weight, and exercise? Eat a healthy diet  Eat a diet that includes plenty of vegetables, fruits, low-fat dairy products, and lean protein. Do not eat a lot of foods that are high in solid fats, added sugars, or sodium. Maintain a healthy weight Body mass index (BMI) is a measurement that can be used to identify possible weight problems. It estimates body fat based on height and weight. Your health care provider can help determine your BMI and help you achieve or maintain a healthy weight. Get regular exercise Get regular exercise. This is one of the most important things you can do for your health. Most adults should: Exercise for at least 150 minutes each week. The exercise should increase your heart rate and make you sweat (moderate-intensity exercise). Do strengthening exercises at least twice a week. This is in addition to the moderate-intensity exercise. Spend less time sitting. Even light physical activity can be beneficial. Watch cholesterol and blood lipids Have your blood tested for lipids and cholesterol at 35 years of age, then have this test every 5 years. You may need to have your cholesterol levels checked more often if: Your lipid or cholesterol levels are high. You are older than 35 years of age. You are at high risk for heart disease. What should I know about cancer screening? Many types of cancers can be detected early and may often be prevented. Depending on your health history and family history, you may need to have cancer screening at various ages. This may include screening for: Colorectal cancer. Prostate cancer. Skin cancer. Lung  cancer. What should I know about heart disease, diabetes, and high blood pressure? Blood pressure and heart disease High blood pressure causes heart disease and increases the risk of stroke. This is more likely to develop in people who have high blood pressure readings or are overweight. Talk with your health care provider about your target blood pressure readings. Have your blood pressure checked: Every 3-5 years if you are 18-39 years of age. Every year if you are 40 years old or older. If you are between the ages of 65 and 75 and are a current or former smoker, ask your health care provider if you should have a one-time screening for abdominal aortic aneurysm (AAA). Diabetes Have regular diabetes screenings. This checks your fasting blood sugar level. Have the screening done: Once every three years after age 45 if you are at a normal weight and have a low risk for diabetes. More often and at a younger age if you are overweight or have a high risk for diabetes. What should I know about preventing infection? Hepatitis B If you have a higher risk for hepatitis B, you should be screened for this virus. Talk with your health care provider to find out if you are at risk for hepatitis B infection. Hepatitis C Blood testing is recommended for: Everyone born from 1945 through 1965. Anyone with known risk factors for hepatitis C. Sexually transmitted infections (STIs) You should be screened each year for STIs, including gonorrhea and chlamydia, if: You are sexually active and are younger than 35 years of age. You are older than 35 years of age and your   health care provider tells you that you are at risk for this type of infection. Your sexual activity has changed since you were last screened, and you are at increased risk for chlamydia or gonorrhea. Ask your health care provider if you are at risk. Ask your health care provider about whether you are at high risk for HIV. Your health care provider  may recommend a prescription medicine to help prevent HIV infection. If you choose to take medicine to prevent HIV, you should first get tested for HIV. You should then be tested every 3 months for as long as you are taking the medicine. Follow these instructions at home: Alcohol use Do not drink alcohol if your health care provider tells you not to drink. If you drink alcohol: Limit how much you have to 0-2 drinks a day. Know how much alcohol is in your drink. In the [place] one drink equals one 12 oz bottle of beer (355 mL), one 5 oz glass of wine (148 mL), or one 1 oz glass of hard liquor (44 mL). Lifestyle Do not use any products that contain nicotine or tobacco. These products include cigarettes, chewing tobacco, and vaping devices, such as e-cigarettes. If you need help quitting, ask your health care provider. Do not use street drugs. Do not share needles. Ask your health care provider for help if you need support or information about quitting drugs. General instructions Schedule regular health, dental, and eye exams. Stay current with your vaccines. Tell your health care provider if: You often feel depressed. You have ever been abused or do not feel safe at home. Summary Adopting a healthy lifestyle and getting preventive care are important in promoting health and wellness. Follow your health care provider's instructions about healthy diet, exercising, and getting tested or screened for diseases. Follow your health care provider's instructions on monitoring your cholesterol and blood pressure. This information is not intended to replace advice given to you by your health care provider. Make sure you discuss any questions you have with your health care provider. Document Revised: 10/08/2020 Document Reviewed: 10/08/2020 Elsevier Patient Education  2023 Elsevier Inc.  

## 2022-06-12 NOTE — Progress Notes (Signed)
Subjective:  Patient ID: Edward Everett, male    DOB: [birth year]  Age: 37 y.o. MRN: [id number]  CC: New Patient (Initial Visit)   HPI Edward Everett is a 37 y.o. year old male who presents today to establish care. He would like an annual physical exam today.  Interval History:  He has smoked cigars infrequently x4 years. 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 Does not ingest enough fruits and veggies Does not see a Dentist or Ophthalmologist.  Complains of intermittent reflux symptoms which occurs when he eats pizza and he thinks this may be from the pizza sauce.  Other foods do not cause reflux symptoms.  He endorses eating late sometimes.  He has no abdominal pain, nausea, vomiting, diarrhea, constipation.  No past medical history on file.  Past Surgical History:  Procedure Laterality Date   NO PAST SURGERIES      Family History  Problem Relation Age of Onset   Diabetes Mother     Social History   Socioeconomic History   Marital status: Single    Spouse name: Not on file   Number of children: Not on file   Years of education: Not on file   Highest education level: Not on file  Occupational History   Not on file  Tobacco Use   Smoking status: Every Day   Smokeless tobacco: Never  Substance and Sexual Activity   Alcohol use: Yes   Drug use: No   Sexual activity: Not on file  Other Topics Concern   Not on file  Social History Narrative   Not on file   Social Determinants of Health   Financial Resource Strain: Not on file  Food Insecurity: Not on file  Transportation Needs: Not on file  Physical Activity: Not on file  Stress: Not on file  Social Connections: Not on file    No Known Allergies  Outpatient Medications Prior to Visit  Medication Sig Dispense Refill   benzonatate (TESSALON) 200 MG capsule Take 1 capsule (200 mg total) by mouth 2 (two) times daily as needed for cough. (Patient not taking: Reported  on 08/28/2019) 20 capsule 0   HYDROcodone-acetaminophen (NORCO/VICODIN) 5-325 MG tablet Take 1 tablet by mouth every 6 (six) hours as needed for severe pain. 10 tablet 0   ibuprofen (ADVIL) 800 MG tablet Take 1 tablet (800 mg total) by mouth 3 (three) times daily. 21 tablet 0   naproxen (NAPROSYN) 500 MG tablet Take 1 tablet (500 mg total) by mouth 2 (two) times daily. 30 tablet 0   ondansetron (ZOFRAN ODT) 8 MG disintegrating tablet Take 1 tablet (8 mg total) by mouth every 8 (eight) hours as needed for nausea or vomiting. (Patient not taking: Reported on 08/28/2019) 20 tablet 0   No facility-administered medications prior to visit.     ROS Review of Systems  Constitutional:  Negative for activity change and appetite change.  HENT:  Negative for sinus pressure and sore throat.   Eyes:  Negative for visual disturbance.  Respiratory:  Negative for cough, chest tightness and shortness of breath.   Cardiovascular:  Negative for chest pain and leg swelling.  Gastrointestinal:  Negative for abdominal distention, abdominal pain, constipation and diarrhea.  Endocrine: Negative.   Genitourinary:  Negative for dysuria.  Musculoskeletal:  Negative for joint swelling and myalgias.  Skin:  Negative for rash.  Allergic/Immunologic: Negative.   Neurological:  Negative for weakness, light-headedness and numbness.  Psychiatric/Behavioral:  Negative for dysphoric mood and suicidal ideas.     Objective:  BP 136/87   Pulse 91   Temp 98.3 F (36.8 C) (Oral)   Ht 6\' 4"  (1.93 m)   Wt 274 lb 6.4 oz (124.5 kg)   SpO2 98%   BMI 33.40 kg/m      06/12/2022    9:01 AM 10/10/2019    7:18 PM 08/28/2019   10:39 AM  BP/Weight  Systolic BP 546 568 127  Diastolic BP 87 96 90  Wt. (Lbs) 274.4    BMI 33.4 kg/m2        Physical Exam Constitutional:      Appearance: He is well-developed.  HENT:     Head: Normocephalic and atraumatic.     Right Ear: External ear normal.     Left Ear: External ear  normal.  Eyes:     Conjunctiva/sclera: Conjunctivae normal.     Pupils: Pupils are equal, round, and reactive to light.  Neck:     Trachea: No tracheal deviation.  Cardiovascular:     Rate and Rhythm: Normal rate and regular rhythm.     Heart sounds: Normal heart sounds. No murmur heard. Pulmonary:     Effort: Pulmonary effort is normal. No respiratory distress.     Breath sounds: Normal breath sounds. No wheezing.  Chest:     Chest wall: No tenderness.  Abdominal:     General: Bowel sounds are normal.     Palpations: Abdomen is soft. There is no mass.     Tenderness: There is no abdominal tenderness.  Musculoskeletal:        General: No tenderness. Normal range of motion.     Cervical back: Normal range of motion and neck supple.  Skin:    General: Skin is warm and dry.     Comments: Right ear keloid, left side of face, anterior chest wall with keloids  Neurological:     Mental Status: He is alert and oriented to person, place, and time.        Latest Ref Rng & Units 04/14/2018   10:09 AM 11/01/2016    9:24 PM  CMP  Glucose 65 - 99 mg/dL 96  116   BUN 6 - 20 mg/dL 9  6   Creatinine 0.76 - 1.27 mg/dL 1.38  1.52   Sodium 134 - 144 mmol/L 142  137   Potassium 3.5 - 5.2 mmol/L 4.2  3.5   Chloride 96 - 106 mmol/L 106  107   CO2 20 - 29 mmol/L 22  22   Calcium 8.7 - 10.2 mg/dL 9.4  8.9   Total Protein 6.0 - 8.5 g/dL 7.2  7.1   Total Bilirubin 0.0 - 1.2 mg/dL 0.4  0.7   Alkaline Phos 39 - 117 IU/L 113  95   AST 0 - 40 IU/L 27  27   ALT 0 - 44 IU/L 36  33     Lipid Panel     Component Value Date/Time   CHOL 214 (H) 04/14/2018 1009   TRIG 100 04/14/2018 1009   HDL 43 04/14/2018 1009   CHOLHDL 5.0 04/14/2018 1009   LDLCALC 151 (H) 04/14/2018 1009    CBC    Component Value Date/Time   WBC 8.0 11/01/2016 2124   RBC 5.68 11/01/2016 2124   HGB 15.6 11/01/2016 2124   HCT 46.2 11/01/2016 2124   PLT 183 11/01/2016 2124   MCV 81.3 11/01/2016 2124   MCH 27.5 11/01/2016  2124   MCHC 33.8 11/01/2016 2124   RDW 13.9 11/01/2016 2124    Lab Results  Component Value Date   HGBA1C 5.7 (A) 06/12/2022    Assessment & Plan:  1. Annual visit for general adult medical examination with abnormal findings Counseled on 150 minutes of exercise per week, healthy eating (including decreased daily intake of saturated fats, cholesterol, added sugars, sodium),  routine healthcare maintenance.   2. Screening for diabetes mellitus (DM) - POCT glycosylated hemoglobin (Hb A1C) - Hemoglobin A1c  3. Encounter for lipid screening for cardiovascular disease - LP+Non-HDL Cholesterol  4. Screening for metabolic disorder - QPR91+MBWG  5. Gastroesophageal reflux disease without esophagitis Placed on omeprazole to be used as needed Advised to avoid recumbency up to 2 hours postmeal, avoid late meals, avoid foods that trigger symptoms. - CBC with Differential/Platelet  6. Screening for viral disease - HCV Ab w Reflex to Quant PCR - HIV Antibody (routine testing w rflx)    Meds ordered this encounter  Medications   omeprazole (PRILOSEC) 40 MG capsule    Sig: Take 1 capsule (40 mg total) by mouth daily.    Dispense:  30 capsule    Refill:  1    Follow-up: Return in about 1 year (around 06/13/2023) for Allen, MD, FAAFP. [facility] and [place], Maish Vaya   06/12/2022, 9:23 AM

## 2022-06-13 LAB — CMP14+EGFR
ALT: 27 IU/L (ref 0–44)
AST: 22 IU/L (ref 0–40)
Albumin/Globulin Ratio: 1.7 (ref 1.2–2.2)
Albumin: 4.3 g/dL (ref 4.1–5.1)
Alkaline Phosphatase: 99 IU/L (ref 44–121)
BUN/Creatinine Ratio: 6 — ABNORMAL LOW (ref 9–20)
BUN: 9 mg/dL (ref 6–20)
Bilirubin Total: 0.3 mg/dL (ref 0.0–1.2)
CO2: 22 mmol/L (ref 20–29)
Calcium: 9.3 mg/dL (ref 8.7–10.2)
Chloride: 107 mmol/L — ABNORMAL HIGH (ref 96–106)
Creatinine, Ser: 1.45 mg/dL — ABNORMAL HIGH (ref 0.76–1.27)
Globulin, Total: 2.6 g/dL (ref 1.5–4.5)
Glucose: 88 mg/dL (ref 70–99)
Potassium: 4.4 mmol/L (ref 3.5–5.2)
Sodium: 141 mmol/L (ref 134–144)
Total Protein: 6.9 g/dL (ref 6.0–8.5)
eGFR: 65 mL/min/{1.73_m2} (ref >59)

## 2022-06-13 LAB — HCV INTERPRETATION

## 2022-06-13 LAB — CBC WITH DIFFERENTIAL/PLATELET
Basophils Absolute: 0 10*3/uL (ref 0.0–0.2)
Basos: 1 %
EOS (ABSOLUTE): 0.1 10*3/uL (ref 0.0–0.4)
Eos: 2 %
Hematocrit: 49.9 % (ref 37.5–51.0)
Hemoglobin: 16.5 g/dL (ref 13.0–17.7)
Immature Grans (Abs): 0 10*3/uL (ref 0.0–0.1)
Immature Granulocytes: 0 %
Lymphocytes Absolute: 2 10*3/uL (ref 0.7–3.1)
Lymphs: 51 %
MCH: 26.6 pg (ref 26.6–33.0)
MCHC: 33.1 g/dL (ref 31.5–35.7)
MCV: 81 fL (ref 79–97)
Monocytes Absolute: 0.3 10*3/uL (ref 0.1–0.9)
Monocytes: 9 %
Neutrophils Absolute: 1.4 10*3/uL (ref 1.4–7.0)
Neutrophils: 37 %
Platelets: 218 10*3/uL (ref 150–450)
RBC: 6.2 x10E6/uL — ABNORMAL HIGH (ref 4.14–5.80)
RDW: 12.8 % (ref 11.6–15.4)
WBC: 3.8 10*3/uL (ref 3.4–10.8)

## 2022-06-13 LAB — LP+NON-HDL CHOLESTEROL
Cholesterol, Total: 205 mg/dL — ABNORMAL HIGH (ref 100–199)
HDL: 49 mg/dL (ref >39)
LDL Chol Calc (NIH): 142 mg/dL — ABNORMAL HIGH (ref 0–99)
Total Non-HDL-Chol (LDL+VLDL): 156 mg/dL — ABNORMAL HIGH (ref 0–129)
Triglycerides: 75 mg/dL (ref 0–149)
VLDL Cholesterol Cal: 14 mg/dL (ref 5–40)

## 2022-06-13 LAB — HIV ANTIBODY (ROUTINE TESTING W REFLEX): HIV Screen 4th Generation wRfx: NONREACTIVE

## 2022-06-13 LAB — HCV AB W REFLEX TO QUANT PCR: HCV Ab: NONREACTIVE

## 2022-08-12 DIAGNOSIS — Z713 Dietary counseling and surveillance: Secondary | ICD-10-CM | POA: Diagnosis not present

## 2022-08-12 DIAGNOSIS — Z6838 Body mass index (BMI) 38.0-38.9, adult: Secondary | ICD-10-CM | POA: Diagnosis not present

## 2022-08-18 DIAGNOSIS — Z713 Dietary counseling and surveillance: Secondary | ICD-10-CM | POA: Diagnosis not present

## 2022-08-18 DIAGNOSIS — Z131 Encounter for screening for diabetes mellitus: Secondary | ICD-10-CM | POA: Diagnosis not present

## 2022-08-18 DIAGNOSIS — Z13220 Encounter for screening for lipoid disorders: Secondary | ICD-10-CM | POA: Diagnosis not present

## 2023-04-23 ENCOUNTER — Encounter: Payer: Self-pay | Admitting: Physician Assistant

## 2023-04-23 ENCOUNTER — Ambulatory Visit: Payer: BC Managed Care – PPO | Attending: Physician Assistant | Admitting: Physician Assistant

## 2023-04-23 VITALS — BP 130/80 | HR 78 | Wt 284.0 lb

## 2023-04-23 DIAGNOSIS — R030 Elevated blood-pressure reading, without diagnosis of hypertension: Secondary | ICD-10-CM | POA: Diagnosis not present

## 2023-04-23 DIAGNOSIS — M722 Plantar fascial fibromatosis: Secondary | ICD-10-CM

## 2023-04-23 DIAGNOSIS — M7631 Iliotibial band syndrome, right leg: Secondary | ICD-10-CM

## 2023-04-23 DIAGNOSIS — M25561 Pain in right knee: Secondary | ICD-10-CM | POA: Diagnosis not present

## 2023-04-23 MED ORDER — OMEPRAZOLE 40 MG PO CPDR: 40.0000 mg | DELAYED_RELEASE_CAPSULE | Freq: Every day | ORAL | 1 refills | Status: DC

## 2023-04-23 MED ORDER — PREDNISONE 10 MG PO TABS: ORAL_TABLET | ORAL | 0 refills | Status: DC

## 2023-04-23 NOTE — Patient Instructions (Signed)
Iliotibial Bursitis  Iliotibial bursitis is the swelling of the fluid-filled sac (bursa) in the knee. A bursa acts as a cushion and protects the joint. If the bursa becomes irritated, it can fill with extra fluid and become inflamed. The iliotibial bursa is located beneath a long tendon (iliotibial band) that connects muscles of the buttock, hip, and upper leg to the outside of the shin bone. This condition is also called iliotibial band friction syndrome. What are the causes? This condition is caused by repeated rubbing of the tendon over the bursa. This occurs with repeated activities. This friction causes fluid to build up inside the bursa. The buildup of fluid inside the bursa causes it to swell. The swollen bursa causes pain in the area where it is located. What increases the risk? The following factors may make you more likely to develop this condition: Participating in athletic activities that include repetitive motion, such as running, cycling, or soccer. Doing athletic activities that involve squatting, side-to-side movements, or cutting maneuvers. Overtraining, or starting a new athletic activity without gradually increasing your time and distance. Being 79-55 years old and having knee arthritis. Being a middle-aged woman who is overweight. Having flat feet or knee deformities. Having diabetes. What are the signs or symptoms? Symptoms of this condition include: Pain on the outside of your knee and areas surrounding the knee, such as the thigh. Tenderness when pressing on the side of your knee. Knee swelling that may or may not include increased warmth or redness. Pain that gets worse with activity such as: Kneeling. Walking down the stairs. Prolonged walking or running. How is this diagnosed? This condition is usually diagnosed based on your symptoms, your medical history, and a physical exam. During the exam, your health care provider will check your: Knee motion. Knee  strength. Amount of pain when the outside of your knee is touched or pressed on. Ability to do activities such as walking or climbing stairs. Rarely, other tests may be done to rule out other causes of your symptoms. These tests may include: MRI. Ultrasound. How is this treated? Treatment for this condition may include: Avoiding activities that cause pain and swelling. Icing your knee. Applying heat to your knee. Wearing an elastic wrap or compression knee sleeve to support your knee. Keeping your knee raised (elevated) when resting. Taking medicine to reduce pain and swelling. Having an injection of numbing medicine or anti-inflammatory medicine (steroid) into the bursa to see if the pain will go away. Doing stretching and strengthening exercises (physical therapy). Treatment usually improves the pain in 6-8 weeks. Surgery is rarely needed to drain or remove the bursa. Follow these instructions at home: If you have a removable compression wrap or sleeve: Wear it as told by your health care provider. Remove it only as told by your health care provider. Loosen the wrap or sleeve if your foot or toes tingle, become numb, or turn cold and blue. Keep the wrap or sleeve clean. If the wrap or sleeve is not waterproof: Remove it if allowed by your health care provider. Do not let it get wet. Cover it with a watertight covering when you take a bath or shower if you must wear it. Managing pain, stiffness, and swelling     If directed, put ice on the knee. If you have a removable wrap or sleeve, remove it as told by your health care provider. Put ice in a plastic bag. Place a towel between your skin and the bag. Leave the ice  on for 20 minutes, 2-3 times a day. Remove the ice if your skin turns bright red. This is very important. If you cannot feel pain, heat, or cold, you have a greater risk of damage to the area. Move your toes often to reduce stiffness and swelling. Elevate your leg  above the level of your heart while you are sitting or lying down. If directed, apply heat to the affected area. Use the heat source that your health care provider recommends, such as a moist heat pack or a heating pad. Place a towel between your skin and the heat source. Leave the heat on for 20-30 minutes. Remove the heat if your skin turns bright red. This is especially important if you are unable to feel pain, heat, or cold. You may have a greater risk of getting burned. Activity Return to your normal activities as told by your health care provider. Ask your health care provider what activities are safe for you. Do exercises as told by your health care provider and physical therapist. General instructions Take over-the-counter and prescription medicines only as told by your health care provider. Sleep with a pillow between your knees. Do not use any products that contain nicotine or tobacco. These products include cigarettes, chewing tobacco, and vaping devices, such as e-cigarettes. These can delay healing. If you need help quitting, ask your health care provider. If you are overweight, work with your health care provider and a dietitian to set a weight-loss goal that is healthy and reasonable for you. Keep all follow-up visits. This is important. How is this prevented? Warm up and stretch before being active. Cool down and stretch after being active. Give your body time to rest between periods of activity. Use equipment that fits you. Maintain physical fitness, including: Strength. Flexibility. Contact a health care provider if: You have pain that is not relieved by rest or treatment. Your symptoms get worse or do not improve with home care. Summary Iliotibial bursitis is the swelling of the fluid-filled sac (bursa) in the knee. Symptoms of this condition include pain on the outside of the knee that gets worse with activity. Treatment includes resting the knee, ice, compression, and  sometimes pain medicines. This information is not intended to replace advice given to you by your health care provider. Make sure you discuss any questions you have with your health care provider. Document Revised: 05/14/2021 Document Reviewed: 05/14/2021 Elsevier Patient Education  2024 [employer] Plantar Fasciitis  Plantar fasciitis is a painful foot condition that affects the heel. It occurs when the band of tissue that connects the toes to the heel bone (plantar fascia) becomes irritated. This can happen as the result of exercising too much or doing other repetitive activities (overuse injury). Plantar fasciitis can cause mild irritation to severe pain that makes it difficult to walk or move. The pain is usually worse in the morning after sleeping, or after sitting or lying down for a period of time. Pain may also be worse after long periods of walking or standing. What are the causes? This condition may be caused by: Standing for long periods of time. Wearing shoes that do not have good arch support. Doing activities that put stress on joints (high-impact activities). This includes ballet and exercise that makes your heart beat faster (aerobic exercise), such as running. Being overweight. An abnormal way of walking (gait). Tight muscles in the back of your lower leg (calf). High arches in your feet or flat feet. Starting a new athletic  activity. What are the signs or symptoms? The main symptom of this condition is heel pain. Pain may get worse after the following: Taking the first steps after a time of rest, especially in the morning after awakening, or after you have been sitting or lying down for a while. Long periods of standing still. Pain may decrease after 30-45 minutes of activity, such as gentle walking. How is this diagnosed? This condition may be diagnosed based on your medical history, a physical exam, and your symptoms. Your health care provider will check for: A tender  area on the bottom of your foot. A high arch in your foot or flat feet. Pain when you move your foot. Difficulty moving your foot. You may have imaging tests to confirm the diagnosis, such as: X-rays. Ultrasound. MRI. How is this treated? Treatment for plantar fasciitis depends on how severe your condition is. Treatment may include: Rest, ice, pressure (compression), and raising (elevating) the affected foot. This is called RICE therapy. Your health care provider may recommend RICE therapy along with over-the-counter pain medicines to manage your pain. Exercises to stretch your calves and your plantar fascia. A splint that holds your foot in a stretched, upward position while you sleep (night splint). Physical therapy to relieve symptoms and prevent problems in the future. Injections of steroid medicine (cortisone) to relieve pain and inflammation. Stimulating your plantar fascia with electrical impulses (extracorporeal shock wave therapy). This is usually the last treatment option before surgery. Surgery, if other treatments have not worked after 12 months. Follow these instructions at home: Managing pain, stiffness, and swelling  If directed, put ice on the painful area. To do this: Put ice in a plastic bag, or use a frozen bottle of water. Place a towel between your skin and the bag or bottle. Roll the bottom of your foot over the bag or bottle. Do this for 20 minutes, 2-3 times a day. Wear athletic shoes that have air-sole or gel-sole cushions, or try soft shoe inserts that are designed for plantar fasciitis. Elevate your foot above the level of your heart while you are sitting or lying down. Activity Avoid activities that cause pain. Ask your health care provider what activities are safe for you. Do physical therapy exercises and stretches as told by your health care provider. Try activities and forms of exercise that are easier on your joints (low impact). Examples include  swimming, water aerobics, and biking. General instructions Take over-the-counter and prescription medicines only as told by your health care provider. Wear a night splint while sleeping, if told by your health care provider. Loosen the splint if your toes tingle, become numb, or turn cold and blue. Maintain a healthy weight, or work with your health care provider to lose weight as needed. Keep all follow-up visits. This is important. Contact a health care provider if you have: Symptoms that do not go away with home treatment. Pain that gets worse. Pain that affects your ability to move or do daily activities. Summary Plantar fasciitis is a painful foot condition that affects the heel. It occurs when the band of tissue that connects the toes to the heel bone (plantar fascia) becomes irritated. Heel pain is the main symptom of this condition. It may get worse after exercising too much or standing still for a long time. Treatment varies, but it usually starts with rest, ice, pressure (compression), and raising (elevating) the affected foot. This is called RICE therapy. Over-the-counter medicines can also be used to manage pain.  This information is not intended to replace advice given to you by your health care provider. Make sure you discuss any questions you have with your health care provider. Document Revised: 09/05/2019 Document Reviewed: 09/05/2019 Elsevier Patient Education  2024 [employer].

## 2023-04-23 NOTE — Progress Notes (Signed)
Patient ID: ELISHA MONTEL, male   DOB: 11-May-1986, 37 y.o.   MRN: [id number]   Yvonne Stueber, is a 37 y.o. male  WGN:[id number]  HQI:[id number]  DOB - 1985/07/21  Chief Complaint  Patient presents with   Knee Pain       Subjective:   Jakaylen Faber is a 37 y.o. male here today for a follow up visit and to establish care. Patient has No headache, No chest pain, No abdominal pain - No Nausea, No new weakness tingling or numbness, No Cough - SOB.  Onset: Location: Duration: Characterization/quality: Alleviating/aggravating: Radiation: Severity:  No problems updated.  ALLERGIES: No Known Allergies  PAST MEDICAL HISTORY: No past medical history on file.  MEDICATIONS AT HOME: Prior to Admission medications   Medication Sig Start Date End Date Taking? Authorizing Provider  predniSONE (DELTASONE) 10 MG tablet 6,5,4,3,2,1 take each day's dose all at once in am with food. 04/23/23  Yes Jeanifer Halliday M, PA-C  omeprazole (PRILOSEC) 40 MG capsule Take 1 capsule (40 mg total) by mouth daily. 04/23/23   Hridaan Bouse, Marzella Schlein, PA-C    ROS: Neg HEENT Neg resp Neg cardiac Neg GI Neg GU Neg MS Neg psych Neg neuro  Objective:   Vitals:   04/23/23 0930  BP: (!) 154/98  Pulse: 78  SpO2: 96%  Weight: 284 lb (128.8 kg)   Exam General appearance : Awake, alert, not in any distress. Speech Clear. Not toxic looking HEENT: Atraumatic and Normocephalic Neck: Supple, no JVD. No cervical lymphadenopathy.  Chest: Good air entry bilaterally, CTAB.  No rales/rhonchi/wheezing CVS: S1 S2 regular, no murmurs.  TTP R iliotibial tendon and bursa.  No effusion or erythema.  Ligaments stable.  Plantar fascia TTP R foot.  Ankle with normal ROM and no Point tenderness Extremities: B/L Lower Ext shows no edema, both legs are warm to touch Neurology: Awake alert, and oriented X 3, CN II-XII intact, Non focal Skin: No Rash  Data Review Lab Results  Component Value Date   HGBA1C 5.7 (A) 06/12/2022     Assessment & Plan   1. Plantar fasciitis Night splints - Ambulatory referral to Orthopedic Surgery - predniSONE (DELTASONE) 10 MG tablet; 6,5,4,3,2,1 take each day's dose all at once in am with food.  Dispense: 21 tablet; Refill: 0  2. Right knee pain, unspecified chronicity - Ambulatory referral to Orthopedic Surgery - predniSONE (DELTASONE) 10 MG tablet; 6,5,4,3,2,1 take each day's dose all at once in am with food.  Dispense: 21 tablet; Refill: 0  3. Iliotibial band tendinitis of right side - Ambulatory referral to Orthopedic Surgery - predniSONE (DELTASONE) 10 MG tablet; 6,5,4,3,2,1 take each day's dose all at once in am with food.  Dispense: 21 tablet; Refill: 0  4. Elevated blood pressure reading Check blood pressure OOO and record.  Goal <130/<85    Return if symptoms worsen or fail to improve.  The patient was given clear instructions to go to ER or return to medical center if symptoms don't improve, worsen or new problems develop. The patient verbalized understanding. The patient was told to call to get lab results if they haven't heard anything in the next week.      Georgian Co, PA-C [facility] [place], [place] [phone]   04/23/2023, 9:55 AM

## 2023-05-04 ENCOUNTER — Other Ambulatory Visit (INDEPENDENT_AMBULATORY_CARE_PROVIDER_SITE_OTHER): Payer: Self-pay

## 2023-05-04 ENCOUNTER — Ambulatory Visit (INDEPENDENT_AMBULATORY_CARE_PROVIDER_SITE_OTHER): Payer: BC Managed Care – PPO | Admitting: Physician Assistant

## 2023-05-04 ENCOUNTER — Encounter: Payer: Self-pay | Admitting: Physician Assistant

## 2023-05-04 DIAGNOSIS — M25561 Pain in right knee: Secondary | ICD-10-CM

## 2023-05-04 DIAGNOSIS — G8929 Other chronic pain: Secondary | ICD-10-CM

## 2023-05-04 MED ORDER — MELOXICAM 15 MG PO TABS: 15.0000 mg | ORAL_TABLET | Freq: Every day | ORAL | 0 refills | Status: DC

## 2023-05-04 NOTE — Progress Notes (Signed)
Office Visit Note   Patient: Edward Everett           Date of Birth: May 14, 1986           MRN: [id number] Visit Date: 05/04/2023              Requested by: Anders Simmonds, PA-C [address] [place],  [place] [address] PCP: Hoy Register, MD   Assessment & Plan: Visit Diagnoses:  1. Chronic pain of right knee     Plan: Pleasant 37 year old gentleman who has a chief complaint 3 months of anterior lateral knee pain on his right knee.  Denies any injuries however he does work at a [employer] and does a lot of work on his knees and standing and crouching down.  Denies any swelling or erythema.  He did take a steroid Dosepak which helped a little bit.  He was also suggested to wear kneepads which again he admits has helped quite a bit.  Could be findings consistent I think he is stable I do not think he has any meniscus pathology based on where he is tender may have plica syndrome or just irritation at the patellofemoral joint.  We discussed trying a 3-week course of meloxicam instead of ibuprofen.  Also offered him an injection he is going to defer that at this time.  Also given information on Voltaren gel  Follow-Up Instructions: No follow-ups on file.   Orders:  Orders Placed This Encounter  Procedures   XR KNEE 3 VIEW RIGHT   Meds ordered this encounter  Medications   meloxicam (MOBIC) 15 MG tablet    Sig: Take 1 tablet (15 mg total) by mouth daily.    Dispense:  30 tablet    Refill:  0      Procedures: No procedures performed   Clinical Data: No additional findings.   Subjective: Chief Complaint  Patient presents with   Right Knee - Pain    HPI Patient is a pleasant 37 year old gentleman with a chief complaint of right knee pain x 3 months.  He describes it below his kneecap into the outside.  He is not taking any pain medication he does have an active job.  Denies any particular injury.  He did take a dose of oral steroids which she thought was  helpful. Review of Systems  All other systems reviewed and are negative.    Objective: Vital Signs: There were no vitals taken for this visit.  Physical Exam Constitutional:      Appearance: Normal appearance.  Pulmonary:     Effort: Pulmonary effort is normal.  Skin:    General: Skin is warm and dry.  Neurological:     General: No focal deficit present.     Mental Status: He is alert and oriented to person, place, and time.  Psychiatric:        Mood and Affect: Mood normal.        Behavior: Behavior normal.     Ortho Exam Right knee no swelling no erythema compartments are soft and compressible distally is neurovascularly intact with good strength.  Good stability he is tender along the lateral edge of the patella.  No sign of subluxation or apprehension no tenderness over the medial or lateral joint line Specialty Comments:  No specialty comments available.  Imaging: XR KNEE 3 VIEW RIGHT  Result Date: 05/04/2023 Three-view radiographs of his right knee reviewed today.  Demonstrate overall well-maintained alignment slight varus.  Slight  sclerotic changes in the medial compartment but overall well-preserved joint    PMFS History: There are no problems to display for this patient.  History reviewed. No pertinent past medical history.  Family History  Problem Relation Age of Onset   Diabetes Mother     Past Surgical History:  Procedure Laterality Date   NO PAST SURGERIES     Social History   Occupational History   Not on file  Tobacco Use   Smoking status: Every Day   Smokeless tobacco: Never  Substance and Sexual Activity   Alcohol use: Yes   Drug use: No   Sexual activity: Not on file

## 2023-06-16 ENCOUNTER — Encounter: Payer: Self-pay | Admitting: Family Medicine

## 2023-06-16 ENCOUNTER — Ambulatory Visit: Payer: BC Managed Care – PPO | Attending: Family Medicine | Admitting: Family Medicine

## 2023-06-16 VITALS — BP 135/89 | HR 85 | Ht 76.0 in | Wt 281.2 lb

## 2023-06-16 DIAGNOSIS — R7303 Prediabetes: Secondary | ICD-10-CM

## 2023-06-16 DIAGNOSIS — K219 Gastro-esophageal reflux disease without esophagitis: Secondary | ICD-10-CM | POA: Diagnosis not present

## 2023-06-16 DIAGNOSIS — F17290 Nicotine dependence, other tobacco product, uncomplicated: Secondary | ICD-10-CM | POA: Diagnosis not present

## 2023-06-16 DIAGNOSIS — Z0001 Encounter for general adult medical examination with abnormal findings: Secondary | ICD-10-CM | POA: Diagnosis not present

## 2023-06-16 DIAGNOSIS — Z13228 Encounter for screening for other metabolic disorders: Secondary | ICD-10-CM

## 2023-06-16 MED ORDER — MELOXICAM 15 MG PO TABS: 15.0000 mg | ORAL_TABLET | Freq: Every day | ORAL | 1 refills | Status: DC

## 2023-06-16 MED ORDER — OMEPRAZOLE 40 MG PO CPDR: 40.0000 mg | DELAYED_RELEASE_CAPSULE | Freq: Every day | ORAL | 1 refills | Status: AC

## 2023-06-16 NOTE — Progress Notes (Signed)
 Subjective:  Patient ID: Edward Everett, male    DOB: [birth year]  Age: 38 y.o. MRN: [id number]  CC: Annual Exam   HPI Edward Everett is a 38 y.o. year old male with a history of nicotine dependence here for an annual exam.  Interval History: Discussed the use of AI scribe software for clinical note transcription with the patient, who gave verbal consent to proceed.  He takes omeprazole  for chronic heartburn and meloxicam  for knee pain. He reports daily exercise, primarily walking, but admits to inadequate fruit and vegetable intake. He has regular dental cleanings and denies any eye problems. He has not eaten breakfast today. He has a history of prediabetes and is advised to reduce starch and sugar intake. He admits to smoking cigars, with the last one on New Year's Day. He denies any current health concerns.        No past medical history on file.  Past Surgical History:  Procedure Laterality Date   NO PAST SURGERIES      Family History  Problem Relation Age of Onset   Diabetes Mother     Social History   Socioeconomic History   Marital status: Single    Spouse name: Not on file   Number of children: Not on file   Years of education: Not on file   Highest education level: Not on file  Occupational History   Not on file  Tobacco Use   Smoking status: Every Day   Smokeless tobacco: Never  Substance and Sexual Activity   Alcohol use: Yes   Drug use: No   Sexual activity: Not on file  Other Topics Concern   Not on file  Social History Narrative   Not on file   Social Drivers of Health   Financial Resource Strain: Low Risk  (06/16/2023)   Overall Financial Resource Strain (CARDIA)    Difficulty of Paying Living Expenses: Not hard at all  Food Insecurity: No Food Insecurity (06/16/2023)   Hunger Vital Sign    Worried About Running Out of Food in the Last Year: Never true    Ran Out of Food in the Last Year: Never true  Transportation Needs: No Transportation  Needs (06/16/2023)   PRAPARE - Administrator, Civil Service (Medical): No    Lack of Transportation (Non-Medical): No  Physical Activity: Sufficiently Active (06/16/2023)   Exercise Vital Sign    Days of Exercise per Week: 7 days    Minutes of Exercise per Session: 30 min  Stress: No Stress Concern Present (06/16/2023)   [employer] of Occupational Health - Occupational Stress Questionnaire    Feeling of Stress : Not at all  Social Connections: Moderately Isolated (06/16/2023)   Social Connection and Isolation Panel [NHANES]    Frequency of Communication with Friends and Family: More than three times a week    Frequency of Social Gatherings with Friends and Family: More than three times a week    Attends Religious Services: Never    Database Administrator or Organizations: No    Attends Banker Meetings: Never    Marital Status: Living with partner    No Known Allergies  Outpatient Medications Prior to Visit  Medication Sig Dispense Refill   meloxicam  (MOBIC ) 15 MG tablet Take 1 tablet (15 mg total) by mouth daily. 30 tablet 0   omeprazole  (PRILOSEC) 40 MG capsule Take 1 capsule (40 mg total) by mouth daily. 90 capsule 1   predniSONE  (  DELTASONE ) 10 MG tablet 6,5,4,3,2,1 take each day's dose all at once in am with food. 21 tablet 0   No facility-administered medications prior to visit.     ROS Review of Systems  Constitutional:  Negative for activity change and appetite change.  HENT:  Negative for sinus pressure and sore throat.   Respiratory:  Negative for chest tightness, shortness of breath and wheezing.   Cardiovascular:  Negative for chest pain and palpitations.  Gastrointestinal:  Negative for abdominal distention, abdominal pain and constipation.  Genitourinary: Negative.   Musculoskeletal: Negative.   Psychiatric/Behavioral:  Negative for behavioral problems and dysphoric mood.     Objective:  BP (!) 144/87   Pulse 85   Ht 6' 4 (1.93  m)   Wt 281 lb 3.2 oz (127.6 kg)   SpO2 98%   BMI 34.23 kg/m      06/16/2023    8:39 AM 04/23/2023   10:01 AM 04/23/2023    9:30 AM  BP/Weight  Systolic BP 144 130 154  Diastolic BP 87 80 98  Wt. (Lbs) 281.2  284  BMI 34.23 kg/m2  34.57 kg/m2      Physical Exam Constitutional:      Appearance: He is well-developed.  HENT:     Head: Normocephalic and atraumatic.     Right Ear: External ear normal.     Left Ear: External ear normal.  Eyes:     Conjunctiva/sclera: Conjunctivae normal.     Pupils: Pupils are equal, round, and reactive to light.  Neck:     Trachea: No tracheal deviation.  Cardiovascular:     Rate and Rhythm: Normal rate and regular rhythm.     Heart sounds: Normal heart sounds. No murmur heard. Pulmonary:     Effort: Pulmonary effort is normal. No respiratory distress.     Breath sounds: Normal breath sounds. No wheezing.  Chest:     Chest wall: No tenderness.  Abdominal:     General: Bowel sounds are normal.     Palpations: Abdomen is soft. There is no mass.     Tenderness: There is no abdominal tenderness.  Musculoskeletal:        General: No tenderness. Normal range of motion.     Cervical back: Normal range of motion and neck supple.  Skin:    General: Skin is warm and dry.  Neurological:     Mental Status: He is alert and oriented to person, place, and time.        Latest Ref Rng & Units 06/12/2022    9:27 AM 04/14/2018   10:09 AM 11/01/2016    9:24 PM  CMP  Glucose 70 - 99 mg/dL 88  96  883   BUN 6 - 20 mg/dL 9  9  6    Creatinine 0.76 - 1.27 mg/dL 8.54  8.61  8.47   Sodium 134 - 144 mmol/L 141  142  137   Potassium 3.5 - 5.2 mmol/L 4.4  4.2  3.5   Chloride 96 - 106 mmol/L 107  106  107   CO2 20 - 29 mmol/L 22  22  22    Calcium 8.7 - 10.2 mg/dL 9.3  9.4  8.9   Total Protein 6.0 - 8.5 g/dL 6.9  7.2  7.1   Total Bilirubin 0.0 - 1.2 mg/dL 0.3  0.4  0.7   Alkaline Phos 44 - 121 IU/L 99  113  95   AST 0 - 40 IU/L 22  27  27  ALT 0 - 44  IU/L 27  36  33     Lipid Panel     Component Value Date/Time   CHOL 205 (H) 06/12/2022 0927   TRIG 75 06/12/2022 0927   HDL 49 06/12/2022 0927   CHOLHDL 5.0 04/14/2018 1009   LDLCALC 142 (H) 06/12/2022 0927    CBC    Component Value Date/Time   WBC 3.8 06/12/2022 0927   WBC 8.0 11/01/2016 2124   RBC 6.20 (H) 06/12/2022 0927   RBC 5.68 11/01/2016 2124   HGB 16.5 06/12/2022 0927   HCT 49.9 06/12/2022 0927   PLT 218 06/12/2022 0927   MCV 81 06/12/2022 0927   MCH 26.6 06/12/2022 0927   MCH 27.5 11/01/2016 2124   MCHC 33.1 06/12/2022 0927   MCHC 33.8 11/01/2016 2124   RDW 12.8 06/12/2022 0927   LYMPHSABS 2.0 06/12/2022 0927   EOSABS 0.1 06/12/2022 0927   BASOSABS 0.0 06/12/2022 9072    Lab Results  Component Value Date   HGBA1C 5.7 (A) 06/12/2022    Assessment & Plan:  Annual physical exam Counseled on 150 minutes of exercise per week, healthy eating (including decreased daily intake of saturated fats, cholesterol, added sugars, sodium), routine healthcare maintenance. -Declined pneumonia vaccine. -Order cholesterol, kidney, and liver function tests. -Schedule follow-up in one year unless abnormal results on blood work.    Gastroesophageal Reflux Disease Stable on Omeprazole . -Refill Omeprazole .  Knee Pain Stable on Meloxicam . -Refill Meloxicam .  Prediabetes Discussed importance of diet modification and exercise. -Order blood work to screen for diabetes.  Tobacco Use Patient continues to smoke cigars. Declined assistance with cessation. -Encourage smoking cessation.            Meds ordered this encounter  Medications   meloxicam  (MOBIC ) 15 MG tablet    Sig: Take 1 tablet (15 mg total) by mouth daily.    Dispense:  30 tablet    Refill:  1   omeprazole  (PRILOSEC) 40 MG capsule    Sig: Take 1 capsule (40 mg total) by mouth daily.    Dispense:  90 capsule    Refill:  1    Follow-up: Return in about 1 year (around 06/15/2024) for CPE/ Preventive  Health Exam.       Corrina Sabin, MD, FAAFP. [facility] [place], [place] [phone]   06/16/2023, 8:52 AM

## 2023-06-16 NOTE — Patient Instructions (Signed)
 VISIT SUMMARY:  Today, you came in for a medication refill and discussed your current health status. You are taking omeprazole  for chronic heartburn and meloxicam  for knee pain. We also reviewed your prediabetes condition and smoking habits. You reported regular exercise but acknowledged a need to improve your diet. You have no current health concerns.  YOUR PLAN:  -GASTROESOPHAGEAL REFLUX DISEASE: Gastroesophageal Reflux Disease (GERD) is a condition where stomach acid frequently flows back into the tube connecting your mouth and stomach. Your condition is stable with the use of Omeprazole , and we have refilled your prescription.  -KNEE PAIN: Your knee pain is being managed with Meloxicam , an anti-inflammatory medication. Your condition is stable, and we have refilled your prescription.  -PREDIABETES: Prediabetes means your blood sugar levels are higher than normal but not yet high enough to be diagnosed as diabetes. We discussed the importance of diet modification and regular exercise. Blood work has been ordered to screen for diabetes.  -TOBACCO USE: You continue to smoke cigars occasionally. While you declined assistance with quitting, we encourage you to consider smoking cessation for your overall health.  -GENERAL HEALTH MAINTENANCE: You declined the pneumonia vaccine. We have ordered tests to check your cholesterol, kidney, and liver function. Please continue with your regular exercise and try to improve your intake of fruits and vegetables.  INSTRUCTIONS:  Please complete the blood work as ordered to screen for diabetes and check your cholesterol, kidney, and liver function. Schedule a follow-up appointment in one year unless there are abnormal results from your blood work.

## 2023-06-17 LAB — CBC WITH DIFFERENTIAL/PLATELET
Basophils Absolute: 0.1 10*3/uL (ref 0.0–0.2)
Basos: 1 %
EOS (ABSOLUTE): 0.1 10*3/uL (ref 0.0–0.4)
Eos: 2 %
Hematocrit: 50.7 % (ref 37.5–51.0)
Hemoglobin: 17.1 g/dL (ref 13.0–17.7)
Immature Grans (Abs): 0 10*3/uL (ref 0.0–0.1)
Immature Granulocytes: 0 %
Lymphocytes Absolute: 2 10*3/uL (ref 0.7–3.1)
Lymphs: 45 %
MCH: 27.4 pg (ref 26.6–33.0)
MCHC: 33.7 g/dL (ref 31.5–35.7)
MCV: 81 fL (ref 79–97)
Monocytes Absolute: 0.5 10*3/uL (ref 0.1–0.9)
Monocytes: 11 %
Neutrophils Absolute: 1.9 10*3/uL (ref 1.4–7.0)
Neutrophils: 41 %
Platelets: 200 10*3/uL (ref 150–450)
RBC: 6.23 x10E6/uL — ABNORMAL HIGH (ref 4.14–5.80)
RDW: 12.2 % (ref 11.6–15.4)
WBC: 4.6 10*3/uL (ref 3.4–10.8)

## 2023-06-17 LAB — CMP14+EGFR
ALT: 21 [IU]/L (ref 0–44)
AST: 20 [IU]/L (ref 0–40)
Albumin: 4.6 g/dL (ref 4.1–5.1)
Alkaline Phosphatase: 96 [IU]/L (ref 44–121)
BUN/Creatinine Ratio: 8 — ABNORMAL LOW (ref 9–20)
BUN: 12 mg/dL (ref 6–20)
Bilirubin Total: 0.2 mg/dL (ref 0.0–1.2)
CO2: 23 mmol/L (ref 20–29)
Calcium: 9.4 mg/dL (ref 8.7–10.2)
Chloride: 105 mmol/L (ref 96–106)
Creatinine, Ser: 1.46 mg/dL — ABNORMAL HIGH (ref 0.76–1.27)
Globulin, Total: 2.3 g/dL (ref 1.5–4.5)
Glucose: 98 mg/dL (ref 70–99)
Potassium: 4.5 mmol/L (ref 3.5–5.2)
Sodium: 141 mmol/L (ref 134–144)
Total Protein: 6.9 g/dL (ref 6.0–8.5)
eGFR: 64 mL/min/{1.73_m2} (ref >59)

## 2023-06-17 LAB — HEMOGLOBIN A1C
Est. average glucose Bld gHb Est-mCnc: 123 mg/dL
Hgb A1c MFr Bld: 5.9 % — ABNORMAL HIGH (ref 4.8–5.6)

## 2023-06-17 LAB — LP+NON-HDL CHOLESTEROL
Cholesterol, Total: 248 mg/dL — ABNORMAL HIGH (ref 100–199)
HDL: 60 mg/dL (ref >39)
LDL Chol Calc (NIH): 176 mg/dL — ABNORMAL HIGH (ref 0–99)
Total Non-HDL-Chol (LDL+VLDL): 188 mg/dL — ABNORMAL HIGH (ref 0–129)
Triglycerides: 74 mg/dL (ref 0–149)
VLDL Cholesterol Cal: 12 mg/dL (ref 5–40)

## 2023-07-06 ENCOUNTER — Encounter: Payer: Self-pay | Admitting: Family Medicine

## 2023-11-24 DIAGNOSIS — Z131 Encounter for screening for diabetes mellitus: Secondary | ICD-10-CM | POA: Diagnosis not present

## 2023-11-24 DIAGNOSIS — Z13220 Encounter for screening for lipoid disorders: Secondary | ICD-10-CM | POA: Diagnosis not present

## 2023-11-24 DIAGNOSIS — Z713 Dietary counseling and surveillance: Secondary | ICD-10-CM | POA: Diagnosis not present

## 2024-03-18 ENCOUNTER — Ambulatory Visit: Admission: EM | Admit: 2024-03-18 | Discharge: 2024-03-18 | Disposition: A | Discharge status: Home / Self Care

## 2024-03-18 ENCOUNTER — Encounter: Payer: Self-pay | Admitting: Emergency Medicine

## 2024-03-18 DIAGNOSIS — L03011 Cellulitis of right finger: Secondary | ICD-10-CM | POA: Diagnosis not present

## 2024-03-18 DIAGNOSIS — Z6836 Body mass index (BMI) 36.0-36.9, adult: Secondary | ICD-10-CM | POA: Diagnosis not present

## 2024-03-18 MED ORDER — AMOXICILLIN-POT CLAVULANATE 875-125 MG PO TABS: 1.0000 | ORAL_TABLET | Freq: Two times a day (BID) | ORAL | 0 refills | Status: AC

## 2024-03-18 NOTE — Discharge Instructions (Signed)
 Soak your finger in warm water with Epsom salt 20 minutes at a time a couple times a day.  Be sure to complete antibiotics in entirety.

## 2024-03-18 NOTE — ED Provider Notes (Signed)
 [facility]    CSN: [id number] Arrival date & time: 03/18/24  1428      History   Chief Complaint Chief Complaint  Patient presents with   Finger Injury    HPI Edward Everett is a 38 y.o. male.   Patient presents today due to 4 days worth of pain of the nailbed around right middle finger.  Patient states that he does bite his nails and is concerned he might have a hangnail.  Patient states that he took 800 mg ibuprofen  with some relief of pain.  The history is provided by the patient.    History reviewed. No pertinent past medical history.  Patient Active Problem List   Diagnosis Date Noted   Prediabetes 06/16/2023    Past Surgical History:  Procedure Laterality Date   NO PAST SURGERIES         Home Medications    Prior to Admission medications   Medication Sig Start Date End Date Taking? Authorizing Provider  amoxicillin -clavulanate (AUGMENTIN ) 875-125 MG tablet Take 1 tablet by mouth every 12 (twelve) hours for 10 days. 03/18/24 03/28/24 Yes Andra Corean BROCKS, PA-C  omeprazole  (PRILOSEC) 40 MG capsule Take 1 capsule (40 mg total) by mouth daily. 06/16/23  Yes Newlin, Enobong, MD  meloxicam  (MOBIC ) 15 MG tablet Take 1 tablet (15 mg total) by mouth daily. 06/16/23   Newlin, Enobong, MD    Family History Family History  Problem Relation Age of Onset   Diabetes Mother     Social History Social History   Tobacco Use   Smoking status: Every Day    Types: Cigarettes    Passive exposure: Current   Smokeless tobacco: Never  Vaping Use   Vaping status: Never Used  Substance Use Topics   Alcohol use: Yes   Drug use: No     Allergies   Patient has no known allergies.   Review of Systems Review of Systems   Physical Exam Triage Vital Signs ED Triage Vitals  Encounter Vitals Group     BP 03/18/24 1456 (!) 136/90     Girls Systolic BP Percentile --      Girls Diastolic BP Percentile --      Boys Systolic BP Percentile --       Boys Diastolic BP Percentile --      Pulse Rate 03/18/24 1456 93     Resp 03/18/24 1456 18     Temp 03/18/24 1456 98.5 F (36.9 C)     Temp Source 03/18/24 1456 Oral     SpO2 03/18/24 1456 98 %     Weight 03/18/24 1456 270 lb (122.5 kg)     Height --      Head Circumference --      Peak Flow --      Pain Score 03/18/24 1455 2     Pain Loc --      Pain Education --      Exclude from Growth Chart --    No data found.  Updated Vital Signs BP (!) 136/90 (BP Location: Left Arm)   Pulse 93   Temp 98.5 F (36.9 C) (Oral)   Resp 18   Wt 270 lb (122.5 kg)   SpO2 98%   BMI 32.87 kg/m   Visual Acuity Right Eye Distance:   Left Eye Distance:   Bilateral Distance:    Right Eye Near:   Left Eye Near:    Bilateral Near:     Physical Exam Vitals and  nursing note reviewed.  Constitutional:      General: He is not in acute distress.    Appearance: Normal appearance. He is not ill-appearing, toxic-appearing or diaphoretic.  Eyes:     General: No scleral icterus. Cardiovascular:     Rate and Rhythm: Normal rate and regular rhythm.     Heart sounds: Normal heart sounds.  Pulmonary:     Effort: Pulmonary effort is normal. No respiratory distress.     Breath sounds: Normal breath sounds. No wheezing or rhonchi.  Musculoskeletal:     Right hand: Swelling (of nailbed of third finger) and tenderness (of nailbed of third finger) present. No deformity or lacerations. Normal range of motion.  Skin:    General: Skin is warm.  Neurological:     Mental Status: He is alert and oriented to person, place, and time.  Psychiatric:        Mood and Affect: Mood normal.        Behavior: Behavior normal.      UC Treatments / Results  Labs (all labs ordered are listed, but only abnormal results are displayed) Labs Reviewed - No data to display  EKG   Radiology No results found.  Procedures Procedures (including critical care time)  Medications Ordered in UC Medications - No data  to display  Initial Impression / Assessment and Plan / UC Course  I have reviewed the triage vital signs and the nursing notes.  Pertinent labs & imaging results that were available during my care of the patient were reviewed by me and considered in my medical decision making (see chart for details).     Paronychia-patient was prescribed Augmentin  875-125 mg twice daily for 10 days, patient advised to  soak his finger in warm Epsom salt water 20 minutes at a time couple times a day. Final Clinical Impressions(s) / UC Diagnoses   Final diagnoses:  Paronychia of finger of right hand     Discharge Instructions      Soak your finger in warm water with Epsom salt 20 minutes at a time a couple times a day.  Be sure to complete antibiotics in entirety.    ED Prescriptions     Medication Sig Dispense Auth. Provider   amoxicillin -clavulanate (AUGMENTIN ) 875-125 MG tablet Take 1 tablet by mouth every 12 (twelve) hours for 10 days. 20 tablet Andra Corean BROCKS, PA-C      PDMP not reviewed this encounter.   Andra Corean BROCKS, PA-C 03/18/24 1549

## 2024-03-18 NOTE — ED Triage Notes (Signed)
 Pt presents c/o R hand 3rd finger pain x 4 days. Pt states,  My finger swelled up on Monday and I started to feel pain on Tuesday. I bite my nails so I feel like I have an Ingrown nail. I keep hitting it at work and it hurts.

## 2024-03-22 ENCOUNTER — Emergency Department (HOSPITAL_COMMUNITY)

## 2024-03-22 ENCOUNTER — Ambulatory Visit: Admission: EM | Admit: 2024-03-22 | Discharge: 2024-03-22 | Disposition: A | Discharge status: Home / Self Care

## 2024-03-22 ENCOUNTER — Other Ambulatory Visit: Payer: Self-pay

## 2024-03-22 ENCOUNTER — Emergency Department (HOSPITAL_COMMUNITY)
Admission: EM | Admit: 2024-03-22 | Discharge: 2024-03-22 | Disposition: A | Discharge status: Home / Self Care | Attending: Emergency Medicine | Admitting: Emergency Medicine

## 2024-03-22 ENCOUNTER — Ambulatory Visit: Payer: Self-pay

## 2024-03-22 DIAGNOSIS — L03011 Cellulitis of right finger: Secondary | ICD-10-CM | POA: Diagnosis not present

## 2024-03-22 DIAGNOSIS — R2231 Localized swelling, mass and lump, right upper limb: Secondary | ICD-10-CM | POA: Diagnosis not present

## 2024-03-22 DIAGNOSIS — M7989 Other specified soft tissue disorders: Secondary | ICD-10-CM | POA: Diagnosis not present

## 2024-03-22 MED ORDER — IBUPROFEN 600 MG PO TABS: 600.0000 mg | ORAL_TABLET | Freq: Four times a day (QID) | ORAL | 0 refills | Status: AC | PRN

## 2024-03-22 MED ORDER — DOXYCYCLINE HYCLATE 100 MG PO CAPS: 100.0000 mg | ORAL_CAPSULE | Freq: Two times a day (BID) | ORAL | 0 refills | Status: AC

## 2024-03-22 MED ORDER — DOXYCYCLINE HYCLATE 100 MG PO TABS
100.0000 mg | ORAL_TABLET | Freq: Once | ORAL | Status: AC
  Administered 2024-03-22: 100 mg via ORAL

## 2024-03-22 MED ORDER — HYDROCODONE-ACETAMINOPHEN 5-325 MG PO TABS
2.0000 | ORAL_TABLET | Freq: Once | ORAL | Status: AC
  Administered 2024-03-22: 2 via ORAL
  Filled 2024-03-22: qty 2

## 2024-03-22 MED ORDER — LIDOCAINE HCL 2 % IJ SOLN
15.0000 mL | Freq: Once | INTRAMUSCULAR | Status: AC
  Administered 2024-03-22: 5 mL
  Filled 2024-03-22: qty 20

## 2024-03-22 MED ORDER — KETOROLAC TROMETHAMINE 10 MG PO TABS: 10.0000 mg | ORAL_TABLET | Freq: Four times a day (QID) | ORAL | 0 refills | Status: AC | PRN

## 2024-03-22 MED ORDER — KETOROLAC TROMETHAMINE 15 MG/ML IJ SOLN
15.0000 mg | Freq: Once | INTRAMUSCULAR | Status: AC
  Administered 2024-03-22: 15 mg via INTRAMUSCULAR

## 2024-03-22 NOTE — ED Notes (Signed)
 Pt complaining of increased pain now; provider aware

## 2024-03-22 NOTE — Telephone Encounter (Signed)
 FYI Only or Action Required?: FYI only for provider.  Patient was last seen in primary care on 06/16/2023 by Newlin, Enobong, MD.  Called Nurse Triage reporting Hand Pain.  Symptoms began several days ago.  Interventions attempted: Prescription medications: Augmentin .  Symptoms are: gradually worsening.  Triage Disposition: See Physician Within 24 Hours  Patient/caregiver understands and will follow disposition?: No appointment, advised to go to urgent care      Copied from CRM #[id number]. Topic: Clinical - Red Word Triage >> Mar 22, 2024 11:22 AM Russell PARAS wrote: Red Word that prompted transfer to Nurse Triage:   Pt began having swelling in middle finger on right hand last Monday Seen at [facility] on Friday, lanced the area and prescribed antibiotic that he is still currently taking.  Continues to have swelling and pain is worse Pain is throbbing Tender on right side of finger  Pt of Dr. Delbert, but wants to see new doctor      Reason for Disposition  Looks infected (spreading redness, pus)  Answer Assessment - Initial Assessment Questions 1. ONSET: When did the pain start?      A few days ago  2. LOCATION and RADIATION: Where is the pain located?  (e.g., fingertip, around nail, joint, entire      Right middle finger  3. SEVERITY: How bad is the pain? What does it keep you from doing?   (Scale 1-10; or mild, moderate, severe)     8/10 4. APPEARANCE: What does the finger look like? (e.g., redness, swelling, bruising, pallor)     Red and swollen 5. WORK OR EXERCISE: Has there been any recent work or exercise that involved this part (i.e., fingers or hand) of the body?     No 6. CAUSE: What do you think is causing the pain?     Infection, seen on 10/17 at urgent care  7. AGGRAVATING FACTORS: What makes the pain worse? (e.g., using computer)     Bending finger  8. OTHER SYMPTOMS: Do you have any other symptoms? (e.g., fever, neck pain, numbness)      No  Protocols used: Finger Pain-A-AH

## 2024-03-22 NOTE — ED Triage Notes (Signed)
 Patient is returning to urgent care to have the following re-evaluated: Right hand: Swelling (of nailbed of third finger) and tenderness (of nailbed of third finger). More swelling and pain. No fever.

## 2024-03-22 NOTE — Discharge Instructions (Addendum)
-  Stop Augmentin  and start Doxycycline . -Doxycycline  twice daily for 7 days.  Make sure to wear sunscreen while spending time outside while on this medication as it can increase your chance of sunburn. You can take this medication with food if you have a sensitive stomach. - Starting tomorrow, take the Toradol  pain medication up to every 6 hours.  Do not take any other NSAIDs with this medication, including ibuprofen  or Advil .  You can take Tylenol  1000 mg up to 3 times daily. -If the swelling continues to get worse instead of better, present to the emergency department.

## 2024-03-22 NOTE — Discharge Instructions (Signed)
 As discussed, continue to take the doxycycline  as prescribed by urgent care provider.  You can use Tylenol  and/or ibuprofen , you have been provided with a prescription of higher dose ibuprofen  to be used as needed for pain.  Keep area clean and dry, change the bandages out if they become wet or soaked through, keep the area clean with soap and water otherwise no other special cleaning supplies would be needed.  Can apply antibiotic ointment with dressing changes.

## 2024-03-22 NOTE — ED Triage Notes (Signed)
 Pt reports concern for right middle finger swelling worsening over the past week. Sts he bites his nails. Was seen at urgent care earlier today. Was prescribed Toradol  and tylenol  for pain. Report no relief of s/s.

## 2024-03-22 NOTE — ED Provider Notes (Signed)
 [facility] Provider Note   CSN: [id number] Arrival date & time: 03/22/24  8071     Patient presents with: Finger Swelling (Right Middle )   Edward Everett is a 38 y.o. male who presents out of concern for swelling on the nailbed of the middle finger of the right hand, states his progressively worsening over the last week, originally presented to urgent care with attempted drainage of paronychia, placed on Augmentin  at the time.  Has gotten progressively worse despite that, presented to the urgent care today and stated that they attempted a needle aspiration with no success, placed him on doxycycline  at discharge.  He presents to the ED out of continued pain as well as worsened swelling to the finger.  He is right-hand dominant and states that he believes he may have inadvertently created the infection due to his chronic nailbiting.   HPI     Prior to Admission medications   Medication Sig Start Date End Date Taking? Authorizing Provider  ibuprofen  (ADVIL ) 600 MG tablet Take 1 tablet (600 mg total) by mouth every 6 (six) hours as needed. 03/22/24  Yes Myriam Dorn BROCKS, PA  amoxicillin -clavulanate (AUGMENTIN ) 875-125 MG tablet Take 1 tablet by mouth every 12 (twelve) hours for 10 days. 03/18/24 03/28/24  Andra Corean BROCKS, PA-C  doxycycline  (VIBRAMYCIN ) 100 MG capsule Take 1 capsule (100 mg total) by mouth 2 (two) times daily for 7 days. 03/22/24 03/29/24  Graham, Laura E, PA-C  ketorolac  (TORADOL ) 10 MG tablet Take 1 tablet (10 mg total) by mouth every 6 (six) hours as needed. 03/22/24   Graham, Laura E, PA-C  omeprazole  (PRILOSEC) 40 MG capsule Take 1 capsule (40 mg total) by mouth daily. 06/16/23   Newlin, Enobong, MD    Allergies: Patient has no known allergies.    Review of Systems  Skin:  Positive for wound.  All other systems reviewed and are negative.   Updated Vital Signs BP (!) 159/116 (BP Location: Left Arm)   Pulse 86    Temp 98.5 F (36.9 C) (Oral)   Resp 18   Ht 6' (1.829 m)   Wt 122.5 kg   SpO2 100%   BMI 36.62 kg/m   Physical Exam Vitals and nursing note reviewed.  Constitutional:      General: He is not in acute distress.    Appearance: Normal appearance.  HENT:     Head: Normocephalic and atraumatic.     Mouth/Throat:     Mouth: Mucous membranes are moist.     Pharynx: Oropharynx is clear.  Eyes:     Extraocular Movements: Extraocular movements intact.     Conjunctiva/sclera: Conjunctivae normal.     Pupils: Pupils are equal, round, and reactive to light.  Cardiovascular:     Rate and Rhythm: Normal rate and regular rhythm.     Pulses: Normal pulses.     Heart sounds: Normal heart sounds. No murmur heard.    No friction rub. No gallop.  Pulmonary:     Effort: Pulmonary effort is normal.     Breath sounds: Normal breath sounds.  Abdominal:     General: Abdomen is flat. Bowel sounds are normal.     Palpations: Abdomen is soft.  Musculoskeletal:        General: Swelling present. Normal range of motion.     Right hand: Swelling and tenderness present.     Left hand: Normal.     Cervical back: Normal range of  motion and neck supple.     Comments: Exquisite tenderness along the volar and dorsal aspects of the distal right middle finger.  There is a area of fluctuance along the nailbed on the lateral side.  Capillary refill is within 2 seconds.  Profound swelling appreciated around the nailbed as well as at the volar aspect.  Skin:    General: Skin is warm and dry.     Capillary Refill: Capillary refill takes less than 2 seconds.  Neurological:     General: No focal deficit present.     Mental Status: He is alert. Mental status is at baseline.  Psychiatric:        Mood and Affect: Mood normal.     (all labs ordered are listed, but only abnormal results are displayed) Labs Reviewed - No data to display  EKG: None  Radiology: DG Hand Complete Right Result Date:  03/22/2024 CLINICAL DATA:  Right third digit swelling, infection EXAM: RIGHT HAND - COMPLETE 3+ VIEW COMPARISON:  None Available. FINDINGS: Frontal, oblique, and lateral views of the right hand are obtained. There is marked soft tissue swelling of the third digit consistent with infection. No subcutaneous gas or radiopaque foreign body. There are no acute or destructive bony abnormalities. Specifically, no bony destruction at the third digit to suggest osteomyelitis. IMPRESSION: 1. Marked soft tissue swelling of the third digit consistent with infection. No subcutaneous gas, radiopaque foreign body, or evidence of osteomyelitis. Electronically Signed   By: Ozell Daring M.D.   On: 03/22/2024 21:00     .Incision and Drainage  Date/Time: 03/22/2024 9:48 PM  Performed by: Myriam Dorn BROCKS, PA Authorized by: Myriam Dorn BROCKS, PA   Consent:    Consent obtained:  Verbal   Consent given by:  Patient   Risks, benefits, and alternatives were discussed: yes     Risks discussed:  Bleeding, pain and infection   Alternatives discussed:  No treatment, delayed treatment, alternative treatment, observation and referral Universal protocol:    Procedure explained and questions answered to patient or proxy's satisfaction: yes     Patient identity confirmed:  Verbally with patient, arm band and hospital-assigned identification number Location:    Type:  Abscess   Size:  2 cm   Location:  Upper extremity   Upper extremity location:  Finger   Finger location:  R long finger Pre-procedure details:    Skin preparation:  Povidone-iodine and chlorhexidine  Sedation:    Sedation type:  None Anesthesia:    Anesthesia method:  Nerve block   Block location:  Digital block   Block needle gauge:  27 G   Block anesthetic:  Lidocaine  2% w/o epi   Block technique:  Dorsal approach   Block injection procedure:  Anatomic landmarks identified, introduced needle, incremental injection, anatomic landmarks palpated  and negative aspiration for blood   Block outcome:  Anesthesia achieved Procedure type:    Complexity:  Simple Procedure details:    Ultrasound guidance: no     Needle aspiration: no     Incision types:  Stab incision   Incision depth:  Dermal   Drainage:  Purulent and bloody   Drainage amount:  Moderate   Wound treatment:  Wound left open   Packing materials:  None Post-procedure details:    Procedure completion:  Tolerated well, no immediate complications    Medications Ordered in the ED  lidocaine  (XYLOCAINE ) 2 % (with pres) injection 300 mg (has no administration in time range)  HYDROcodone -acetaminophen  (NORCO/VICODIN) 5-325  MG per tablet 2 tablet (2 tablets Oral Given 03/22/24 2043)                                    Medical Decision Making Amount and/or Complexity of Data Reviewed Radiology: ordered.  Risk Prescription drug management.   Based on the patient's presenting complaint obtain x-ray of the right hand to rule out deep tissue infection, x-ray was negative.  Performed incision and drainage as noted in the procedure note which produced a moderate amount of purulent drainage.  Further, will have patient continue previously prescribed doxycycline  that he obtained at urgent care.  Then, the plan is to have patient follow-up with hand surgery for further assessment of likely felon as there is extensive edema to the pulp area of the middle finger suggesting infection beyond paronychia.  Will have patient continue to take Tylenol  and/or ibuprofen  as needed for pain, will send in a 600 mg ibuprofen  to be used as needed every 8 hours for pain.  Discussed wound care, patient understands and agrees and has no further concerns at this time.     Final diagnoses:  Felon of finger of right hand    ED Discharge Orders          Ordered    ibuprofen  (ADVIL ) 600 MG tablet  Every 6 hours PRN        03/22/24 2158               Myriam Dorn BROCKS, [place] 03/22/24  2158    Freddi Hamilton, MD 03/22/24 2258

## 2024-03-22 NOTE — ED Provider Notes (Signed)
 [facility]    CSN: [id number] Arrival date & time: 03/22/24  1652      History   Chief Complaint Chief Complaint  Patient presents with   Follow-up    HPI Edward Everett is a 38 y.o. male presenting with continued right hand issue.  Medical history prediabetes.  He first presented to this urgent care on 03/18/2024 with paronychia.  They attempted to drain this with scalpel, and were unsuccessful.  Augmentin  was prescribed, which has not improved his symptoms.  He describes unbearable pain, worse with movement.  Has been soaking the finger.  Denies drainage at home.  HPI  History reviewed. No pertinent past medical history.  Patient Active Problem List   Diagnosis Date Noted   Prediabetes 06/16/2023    Past Surgical History:  Procedure Laterality Date   NO PAST SURGERIES         Home Medications    Prior to Admission medications   Medication Sig Start Date End Date Taking? Authorizing Provider  amoxicillin -clavulanate (AUGMENTIN ) 875-125 MG tablet Take 1 tablet by mouth every 12 (twelve) hours for 10 days. 03/18/24 03/28/24 Yes Andra Krabbe C, PA-C  doxycycline  (VIBRAMYCIN ) 100 MG capsule Take 1 capsule (100 mg total) by mouth 2 (two) times daily for 7 days. 03/22/24 03/29/24 Yes Shiri Hodapp E, PA-C  ketorolac  (TORADOL ) 10 MG tablet Take 1 tablet (10 mg total) by mouth every 6 (six) hours as needed. 03/22/24  Yes Arlyss Leita BRAVO, PA-C  omeprazole  (PRILOSEC) 40 MG capsule Take 1 capsule (40 mg total) by mouth daily. 06/16/23  Yes Newlin, Enobong, MD    Family History Family History  Problem Relation Age of Onset   Diabetes Mother     Social History Social History   Tobacco Use   Smoking status: Every Day    Types: Cigarettes    Passive exposure: Current   Smokeless tobacco: Never  Vaping Use   Vaping status: Never Used  Substance Use Topics   Alcohol use: Yes   Drug use: No     Allergies   Patient has no known  allergies.   Review of Systems Review of Systems  Skin:  Negative for wound.       Paronychia     Physical Exam Triage Vital Signs ED Triage Vitals  Encounter Vitals Group     BP 03/22/24 1708 (!) 137/93     Girls Systolic BP Percentile --      Girls Diastolic BP Percentile --      Boys Systolic BP Percentile --      Boys Diastolic BP Percentile --      Pulse Rate 03/22/24 1708 87     Resp 03/22/24 1708 20     Temp 03/22/24 1708 98.8 F (37.1 C)     Temp Source 03/22/24 1708 Oral     SpO2 03/22/24 1708 98 %     Weight 03/22/24 1705 270 lb 1 oz (122.5 kg)     Height 03/22/24 1705 6' 4 (1.93 m)     Head Circumference --      Peak Flow --      Pain Score 03/22/24 1702 7     Pain Loc --      Pain Education --      Exclude from Growth Chart --    No data found.  Updated Vital Signs BP (!) 137/93 (BP Location: Left Arm)   Pulse 87   Temp 98.8 F (37.1 C) (Oral)  Resp 20   Ht 6' 4 (1.93 m)   Wt 270 lb 1 oz (122.5 kg)   SpO2 98%   BMI 32.87 kg/m   Visual Acuity Right Eye Distance:   Left Eye Distance:   Bilateral Distance:    Right Eye Near:   Left Eye Near:    Bilateral Near:     Physical Exam Vitals reviewed.  Constitutional:      General: He is not in acute distress.    Appearance: Normal appearance. He is not ill-appearing.  HENT:     Head: Normocephalic and atraumatic.  Pulmonary:     Effort: Pulmonary effort is normal.  Skin:    Comments: See image below. Swelling and tenderness to the medial aspect of the nailbed of the middle finger.  There is no fluctuance, and there is no drainage.  Range of motion intact but with pain.  Cap refill less than 2 seconds.  Neurological:     General: No focal deficit present.     Mental Status: He is alert and oriented to person, place, and time.  Psychiatric:        Mood and Affect: Mood normal.        Behavior: Behavior normal.        Thought Content: Thought content normal.        Judgment: Judgment  normal.       UC Treatments / Results  Labs (all labs ordered are listed, but only abnormal results are displayed) Labs Reviewed - No data to display  EKG   Radiology No results found.  Procedures Incision and Drainage  Date/Time: 03/22/2024 7:10 PM  Performed by: Arlyss Leita BRAVO, PA-C Authorized by: Arlyss Leita BRAVO, PA-C   Consent:    Consent obtained:  Verbal   Consent given by:  Patient   Risks, benefits, and alternatives were discussed: yes     Risks discussed:  Bleeding, incomplete drainage, pain and infection Universal protocol:    Patient identity confirmed:  Verbally with patient Location:    Indications for incision and drainage: R middle finger. Pre-procedure details:    Skin preparation:  Chlorhexidine  with alcohol Anesthesia:    Anesthesia method:  Topical application Procedure type:    Complexity:  Simple Procedure details:    Incision types:  Stab incision   Drainage:  Bloody Comments:     Prior to procedure, we discussed that I may not be successful in draining the paronychia, as there is minimal fluctuance, and no spontaneous drainage.  The patient elects to proceed.  Topical anesthesia achieved with pain ease numbing spray.  I attempted to insert an 18-gauge needle into the most fluctuant portion of the paronychia.  Unfortunately, the patient did not tolerate this procedure at all, and I was unable to advance the needle at all.  (including critical care time)  Medications Ordered in UC Medications  ketorolac  (TORADOL ) 15 MG/ML injection 15 mg (15 mg Intramuscular Given 03/22/24 1856)  doxycycline  (VIBRA -TABS) tablet 100 mg (100 mg Oral Given 03/22/24 1904)    Initial Impression / Assessment and Plan / UC Course  I have reviewed the triage vital signs and the nursing notes.  Pertinent labs & imaging results that were available during my care of the patient were reviewed by me and considered in my medical decision making (see chart for  details).     The patient is a 38 year old male presenting for follow-up of right middle finger paronychia.  The finger is not fluctuant, though after  discussion, patient wishes me to proceed with drainage.  The procedure was poorly tolerated, and there was no purulent drainage.  As his symptoms have continued to worsen with the Augmentin , we change the antibiotic to doxycycline .  Patient's primary concern is pain, so IM Toradol  was administered during visit, and I sent a prescription of this to the pharmacy.  He does not have kidney disease, and the last NSAID was taken about 10 hours ago.  Encouraged him to follow-up with the emergency department if symptoms worsen or persist.   Final Clinical Impressions(s) / UC Diagnoses   Final diagnoses:  Paronychia of right middle finger     Discharge Instructions      -Stop Augmentin  and start Doxycycline . -Doxycycline  twice daily for 7 days.  Make sure to wear sunscreen while spending time outside while on this medication as it can increase your chance of sunburn. You can take this medication with food if you have a sensitive stomach. - Starting tomorrow, take the Toradol  pain medication up to every 6 hours.  Do not take any other NSAIDs with this medication, including ibuprofen  or Advil .  You can take Tylenol  1000 mg up to 3 times daily. -If the swelling continues to get worse instead of better, present to the emergency department.     ED Prescriptions     Medication Sig Dispense Auth. Provider   ketorolac  (TORADOL ) 10 MG tablet Take 1 tablet (10 mg total) by mouth every 6 (six) hours as needed. 20 tablet Detavious Rinn E, PA-C   doxycycline  (VIBRAMYCIN ) 100 MG capsule Take 1 capsule (100 mg total) by mouth 2 (two) times daily for 7 days. 14 capsule Ceonna Frazzini E, PA-C      PDMP not reviewed this encounter.   Arlyss Leita BRAVO, PA-C 03/22/24 1914

## 2024-04-04 ENCOUNTER — Encounter: Payer: Self-pay | Admitting: Radiology

## 2024-06-15 ENCOUNTER — Telehealth: Payer: Self-pay | Admitting: Family Medicine

## 2024-06-15 ENCOUNTER — Ambulatory Visit: Payer: BC Managed Care – PPO | Attending: Family Medicine | Admitting: Family Medicine

## 2024-06-15 NOTE — Telephone Encounter (Signed)
 Contacted patient, no answer. Left voicemail requesting patient to call back.
